# Patient Record
Sex: Female | Born: 1940 | Hispanic: Yes | Marital: Married | Smoking: Never smoker
Health system: Southern US, Community
[De-identification: ages and names within clinical notes are randomized; demographics above are authoritative.]

## PROBLEM LIST (undated history)

## (undated) DIAGNOSIS — M542 Cervicalgia: Secondary | ICD-10-CM

## (undated) DIAGNOSIS — M1990 Unspecified osteoarthritis, unspecified site: Secondary | ICD-10-CM

## (undated) DIAGNOSIS — R739 Hyperglycemia, unspecified: Secondary | ICD-10-CM

## (undated) HISTORY — PX: ABDOMINAL HYSTERECTOMY: SHX81

## (undated) HISTORY — DX: Unspecified osteoarthritis, unspecified site: M19.90

## (undated) HISTORY — PX: SHOULDER SURGERY: SHX246

---

## 2017-10-09 ENCOUNTER — Encounter (HOSPITAL_COMMUNITY): Payer: Self-pay | Admitting: *Deleted

## 2017-10-09 ENCOUNTER — Ambulatory Visit (HOSPITAL_COMMUNITY)
Admission: EM | Admit: 2017-10-09 | Discharge: 2017-10-09 | Disposition: A | Discharge status: Home / Self Care | Payer: Medicare Other | Attending: Internal Medicine | Admitting: Internal Medicine

## 2017-10-09 DIAGNOSIS — M542 Cervicalgia: Secondary | ICD-10-CM

## 2017-10-09 DIAGNOSIS — H8149 Vertigo of central origin, unspecified ear: Secondary | ICD-10-CM

## 2017-10-09 DIAGNOSIS — R42 Dizziness and giddiness: Secondary | ICD-10-CM

## 2017-10-09 HISTORY — DX: Cervicalgia: M54.2

## 2017-10-09 HISTORY — DX: Hyperglycemia, unspecified: R73.9

## 2017-10-09 LAB — POCT I-STAT, CHEM 8
BUN: 14 mg/dL (ref 8–23)
Calcium, Ion: 0.91 mmol/L — ABNORMAL LOW (ref 1.15–1.40)
Chloride: 102 mmol/L (ref 98–111)
Creatinine, Ser: 0.5 mg/dL (ref 0.44–1.00)
Glucose, Bld: 174 mg/dL — ABNORMAL HIGH (ref 70–99)
HCT: 45 % (ref 36.0–46.0)
Hemoglobin: 15.3 g/dL — ABNORMAL HIGH (ref 12.0–15.0)
Potassium: 3.8 mmol/L (ref 3.5–5.1)
Sodium: 134 mmol/L — ABNORMAL LOW (ref 135–145)
TCO2: 26 mmol/L (ref 22–32)

## 2017-10-09 MED ORDER — MECLIZINE HCL 12.5 MG PO TABS: 12.5000 mg | ORAL_TABLET | Freq: Three times a day (TID) | ORAL | 0 refills | Status: AC | PRN

## 2017-10-09 MED ORDER — MELOXICAM 7.5 MG PO TABS: 7.5000 mg | ORAL_TABLET | Freq: Every day | ORAL | 0 refills | Status: AC

## 2017-10-09 NOTE — ED Triage Notes (Signed)
C/O neck pain with vertigo and "seeing silvery shooting stars in my eyes".  States has neck vertebrae problems, has had all sxs in past with vertigo as well.  Has had visual disturbances with vertigo in past.

## 2017-10-09 NOTE — Discharge Instructions (Signed)
Your EKG looks normal Your blood pressure did drop when going from sitting to standing, please be sure to drink plenty of fluids to stay hydrated, please take your time when standing up, if you feel dizzy please sit down and that your self readjust.  Please use meclizine as needed when you will be at home or at bedtime to help with dizziness, this will cause drowsiness For your neck pain please take Mobic daily, do not use with Advil  Please follow-up if symptoms worsening, not improving, developing worsening lightheadedness, dizziness or pain.

## 2017-10-10 NOTE — ED Provider Notes (Signed)
[facility]    CSN: [id number] Arrival date & time: 10/09/17  1501     History   Chief Complaint Chief Complaint  Patient presents with  . Neck Pain  . Dizziness    [name] is a 77 y.o. female history of DM , HTN, vertigo, presenting today for evaluation of neck pain and dizziness. She states for the past 4 days she has had dizziness/room spinning with certain head movements. She has also occasionally felt lightheaded and saw spots. She has a history of vertigo and states she feels very similar to before but her symptoms have lasted longer than they typically do. She also notes pain in her neck, specifically on the right side which she typically does not get with her vertigo. She denies any injury, but does admit to increase in activity recently as she moved here from [place]. In the past meclizine has helped her. Denies SOB, chest pain, syncope, weakness. She still feels very strong. Drinks a lot of fluids.   HPI  Past Medical History:  Diagnosis Date  . Cervical pain   . Hyperglycemia     There are no active problems to display for this patient.   Past Surgical History:  Procedure Laterality Date  . SHOULDER SURGERY      OB History   None      Home Medications    Prior to Admission medications   Medication Sig Start Date End Date Taking? Authorizing Provider  Estrogens Conjugated (PREMARIN PO) Take by mouth.   Yes [provider]  GLIPIZIDE PO Take by mouth. Only occasionally   Yes [provider]  meclizine (ANTIVERT) 12.5 MG tablet Take 1 tablet (12.5 mg total) by mouth 3 (three) times daily as needed for dizziness. 10/09/17   Wieters, Hallie C, PA-C  meloxicam (MOBIC) 7.5 MG tablet Take 1 tablet (7.5 mg total) by mouth daily for 7 days. Take in the morning, with food. 10/09/17 10/16/17  Wieters, Junius CreamerHallie C, PA-C    Family History History reviewed. No pertinent family history.  Social History Social History   Tobacco Use   . Smoking status: Never Smoker  . Smokeless tobacco: Never Used  Substance Use Topics  . Alcohol use: Never    Frequency: Never  . Drug use: Never     Allergies   Patient has no known allergies.   Review of Systems Review of Systems  Constitutional: Negative for fatigue and fever.  HENT: Negative for congestion, sinus pressure and sore throat.   Eyes: Negative for photophobia, pain and visual disturbance.  Respiratory: Negative for cough and shortness of breath.   Cardiovascular: Negative for chest pain.  Gastrointestinal: Positive for nausea. Negative for abdominal pain and vomiting.  Genitourinary: Negative for decreased urine volume and hematuria.  Musculoskeletal: Positive for myalgias and neck pain. Negative for back pain, gait problem and neck stiffness.  Neurological: Positive for dizziness and light-headedness. Negative for syncope, facial asymmetry, speech difficulty, weakness, numbness and headaches.     Physical Exam Triage Vital Signs ED Triage Vitals  Enc Vitals Group     BP 10/09/17 1633 (!) 156/72     Pulse Rate 10/09/17 1633 72     Resp 10/09/17 1633 18     Temp 10/09/17 1633 97.6 F (36.4 C)     Temp src --      SpO2 10/09/17 1633 100 %     Weight --      Height --  Head Circumference --      Peak Flow --      Pain Score 10/09/17 1636 8     Pain Loc --      Pain Edu? --      Excl. in GC? --    Orthostatic VS for the past 24 hrs:  BP- Lying Pulse- Lying BP- Sitting Pulse- Sitting BP- Standing at 0 minutes Pulse- Standing at 0 minutes  10/09/17 1727 161/69 63 167/68 74 142/78 64    Updated Vital Signs BP (!) 156/72   Pulse 72   Temp 97.6 F (36.4 C)   Resp 18   SpO2 100%   Visual Acuity Right Eye Distance:   Left Eye Distance:   Bilateral Distance:    Right Eye Near:   Left Eye Near:    Bilateral Near:     Physical Exam  Constitutional: She is oriented to person, place, and time. She appears well-developed and well-nourished.  No distress.  HENT:  Head: Normocephalic and atraumatic.  Mouth/Throat: Oropharynx is clear and moist.  Eyes: Pupils are equal, round, and reactive to light. Conjunctivae and EOM are normal.  Neck: Neck supple.  Tenderness to right trapezius/neck musculature, full active ROM of neck, no carotid bruits auscultated  Cardiovascular: Normal rate and regular rhythm.  No murmur heard. Pulmonary/Chest: Effort normal and breath sounds normal. No respiratory distress.  Abdominal: Soft. There is no tenderness.  Musculoskeletal: She exhibits no edema.  Neurological: She is alert and oriented to person, place, and time.  Patient A&O x3, cranial nerves II-XII grossly intact, strength at shoulders, hips and knees 5/5, equal bilaterally, patellar reflex 2+ bilaterally. Normal Finger to nose, RAM and heel to shin. Negative Romberg and Pronator Drift. Gait without abnormality.   Skin: Skin is warm and dry.  Psychiatric: She has a normal mood and affect.  Nursing note and vitals reviewed.    UC Treatments / Results  Labs (all labs ordered are listed, but only abnormal results are displayed) Labs Reviewed  POCT I-STAT, CHEM 8 - Abnormal; Notable for the following components:      Result Value   Sodium 134 (*)    Glucose, Bld 174 (*)    Calcium, Ion 0.91 (*)    Hemoglobin 15.3 (*)    All other components within normal limits    EKG None  Radiology No results found.  Procedures Procedures (including critical care time)  Medications Ordered in UC Medications - No data to display  Initial Impression / Assessment and Plan / UC Course  I have reviewed the triage vital signs and the nursing notes.  Pertinent labs & imaging results that were available during my care of the patient were reviewed by me and considered in my medical decision making (see chart for details).     Istat relatively normal, minor abberations. EKG normal sinus rhythm, no acute signs of ischemia or infarction or  arrythmia/abnormal beats. Patient was orthostatic going from sitting to standing but resolved. Symptoms likely combo of orthostasis and vertigo. Meclizine PRN for nausea and dizziness, discussed drowsiness with patient and increased risk of falls. mobic 7.5 for neck pain as alternative for advil. Seems muscular.   VSS, no neuro deficit, no red flags; will have patient return if symptoms worsening/changing. Discussed strict return precautions. Patient verbalized understanding and is agreeable with plan.  Final Clinical Impressions(s) / UC Diagnoses   Final diagnoses:  Neck pain  Vertigo     Discharge Instructions     Your  EKG looks normal Your blood pressure did drop when going from sitting to standing, please be sure to drink plenty of fluids to stay hydrated, please take your time when standing up, if you feel dizzy please sit down and that your self readjust.  Please use meclizine as needed when you will be at home or at bedtime to help with dizziness, this will cause drowsiness For your neck pain please take Mobic daily, do not use with Advil  Please follow-up if symptoms worsening, not improving, developing worsening lightheadedness, dizziness or pain.   ED Prescriptions    Medication Sig Dispense Auth. Provider   meclizine (ANTIVERT) 12.5 MG tablet Take 1 tablet (12.5 mg total) by mouth 3 (three) times daily as needed for dizziness. 30 tablet Wieters, Hallie C, PA-C   meloxicam (MOBIC) 7.5 MG tablet Take 1 tablet (7.5 mg total) by mouth daily for 7 days. Take in the morning, with food. 7 tablet Wieters, Hallie C, PA-C     Controlled Substance Prescriptions [place] Controlled Substance Registry consulted? Not Applicable   Lew Dawes, [place] 10/10/17 [phone]

## 2018-11-01 ENCOUNTER — Other Ambulatory Visit: Payer: Self-pay | Admitting: Surgery

## 2018-11-04 ENCOUNTER — Other Ambulatory Visit: Payer: Self-pay | Admitting: Surgery

## 2018-11-04 DIAGNOSIS — R1032 Left lower quadrant pain: Secondary | ICD-10-CM

## 2018-11-11 ENCOUNTER — Other Ambulatory Visit: Payer: Self-pay

## 2018-11-11 ENCOUNTER — Ambulatory Visit
Admission: RE | Admit: 2018-11-11 | Discharge: 2018-11-11 | Disposition: A | Discharge status: Home / Self Care | Payer: Medicare PPO | Source: Ambulatory Visit | Attending: Surgery | Admitting: Surgery

## 2018-11-11 DIAGNOSIS — R1032 Left lower quadrant pain: Secondary | ICD-10-CM

## 2018-11-11 IMAGING — CT CT ABD-PELV W/ CM
1 of 3 series · 13 of 32 positions shown, 18 images · IV contrast (APPLIED)
Comparison: None.

CLINICAL DATA: Colicky left lower quadrant pain.

EXAM:
CT ABDOMEN AND PELVIS WITH CONTRAST
TECHNIQUE: Multidetector CT imaging of the abdomen and pelvis was performed
using the standard protocol following bolus administration of
intravenous contrast.
CONTRAST:  100mL [R3] IOPAMIDOL ([R3]) INJECTION 61%

[Series 2: abd/pelvis w/cm · axial · 0.73mm/px · z∈[-400,+0]mm · 13 of 94 slices shown, 18 images]
[im 7/94  soft-tissue]
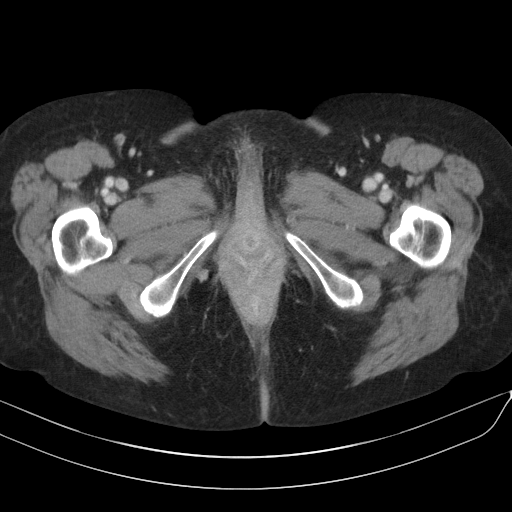
[im 7/94  bone]
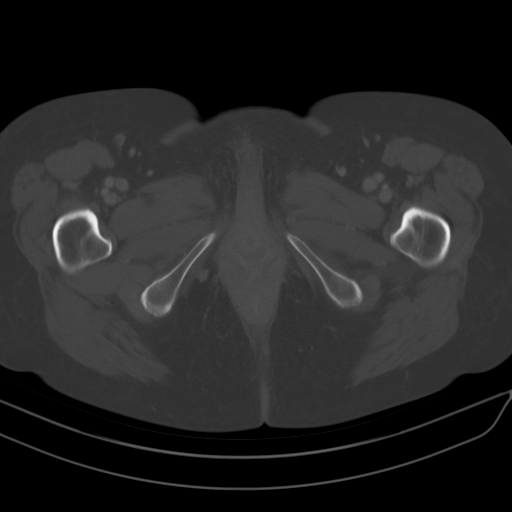
[im 13/94  soft-tissue]
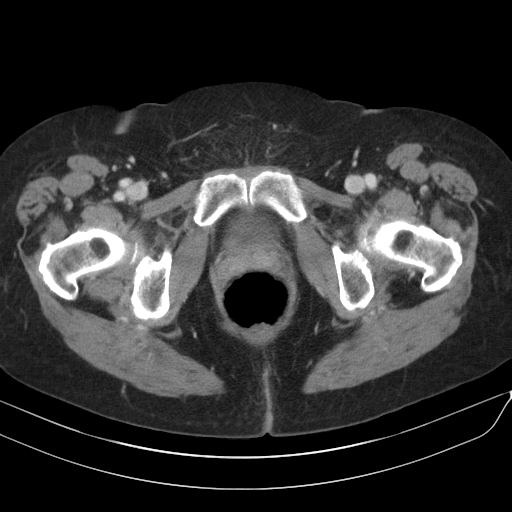
[im 19/94  soft-tissue]
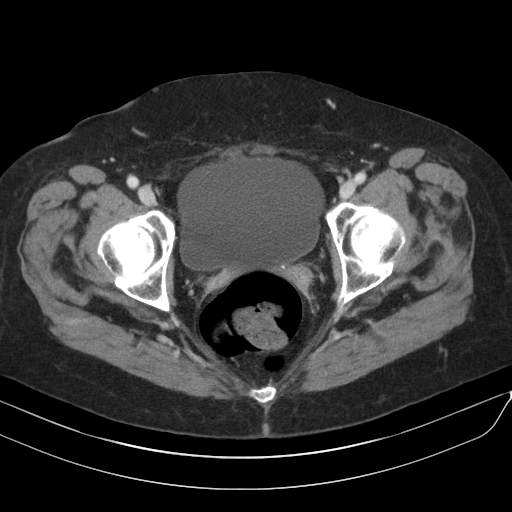
[im 32/94  soft-tissue]
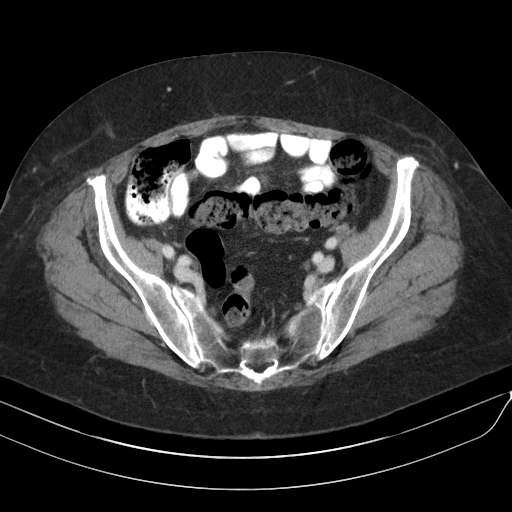
[im 38/94  soft-tissue]
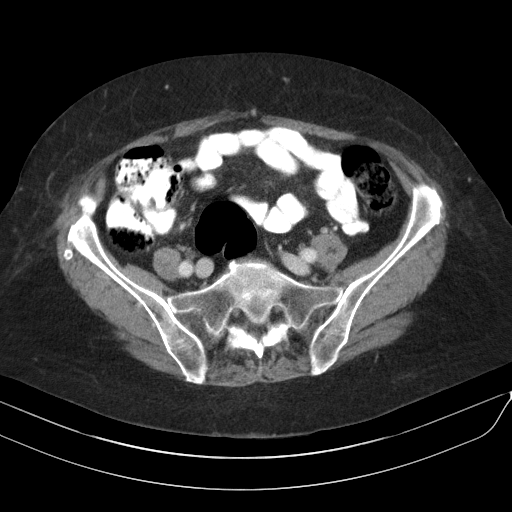
[im 44/94  soft-tissue]
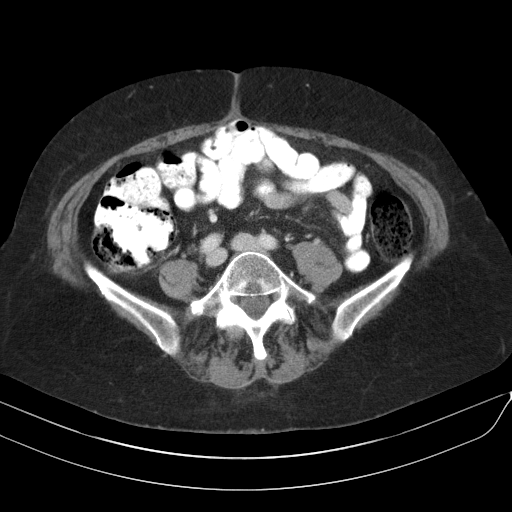
[im 50/94  soft-tissue]
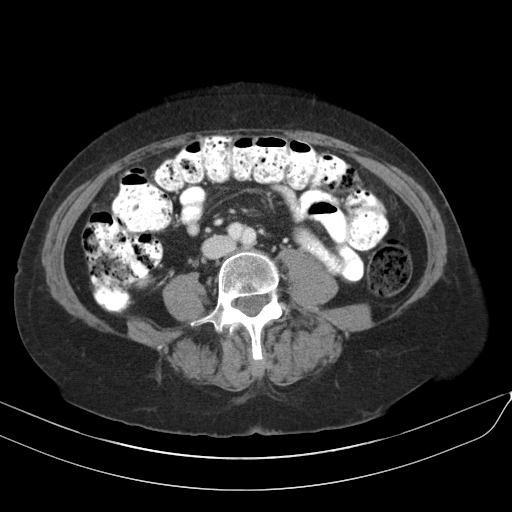
[im 56/94  soft-tissue]
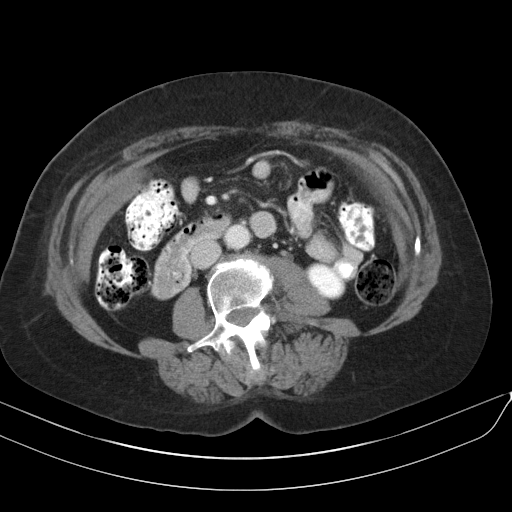
[im 63/94  soft-tissue]
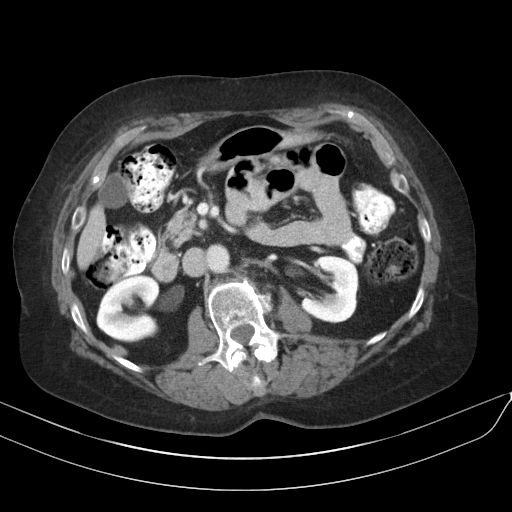
[im 63/94  bone]
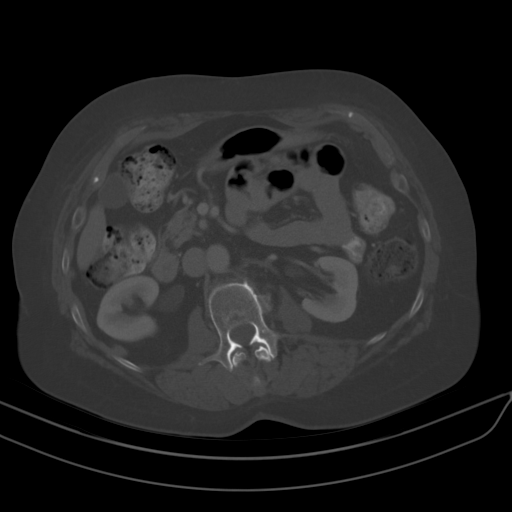
[im 69/94  lung]
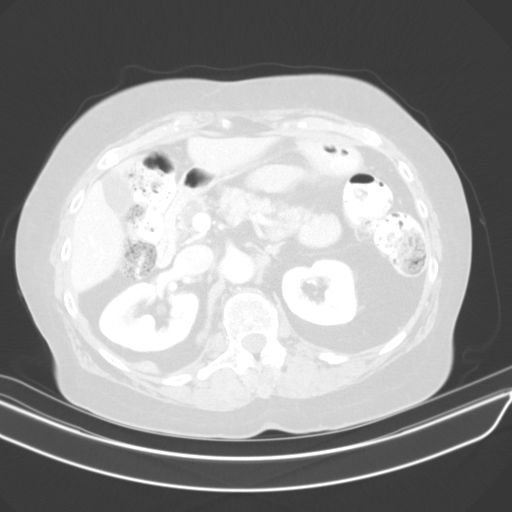
[im 75/94  soft-tissue]
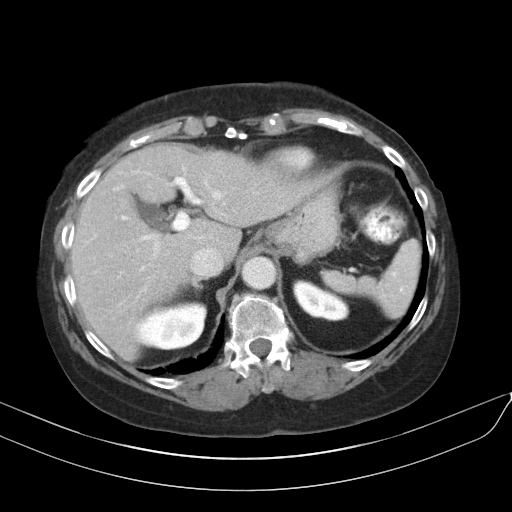
[im 75/94  lung]
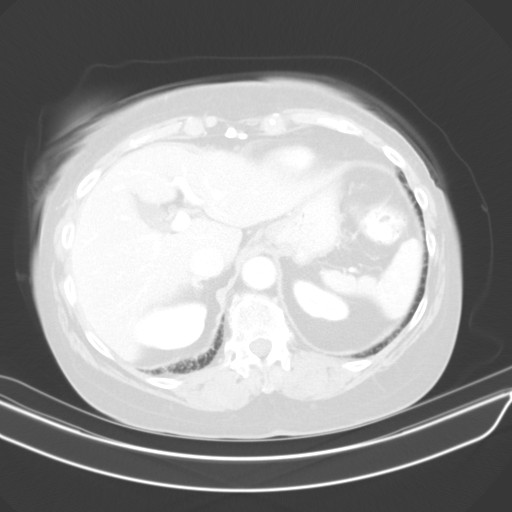
[im 81/94  soft-tissue]
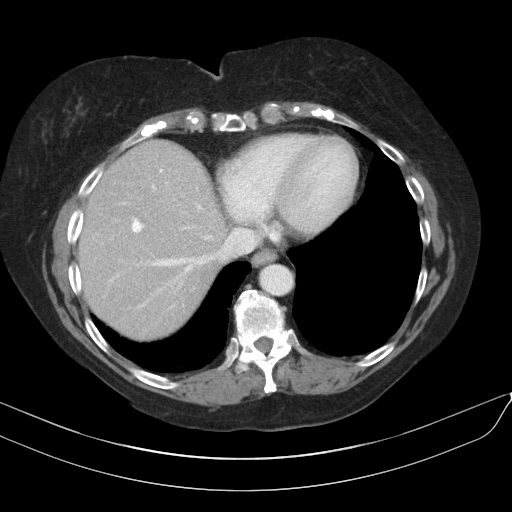
[im 81/94  lung]
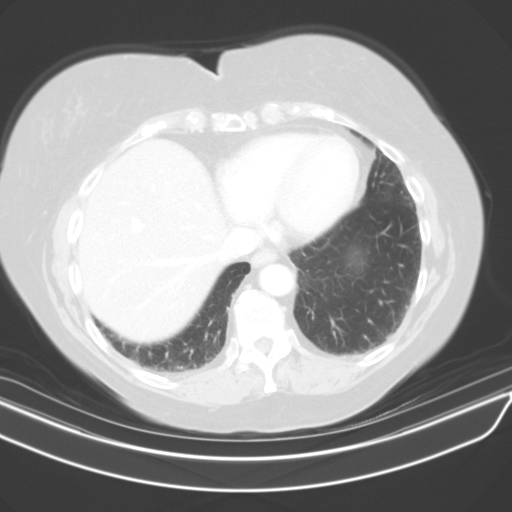
[im 87/94  soft-tissue]
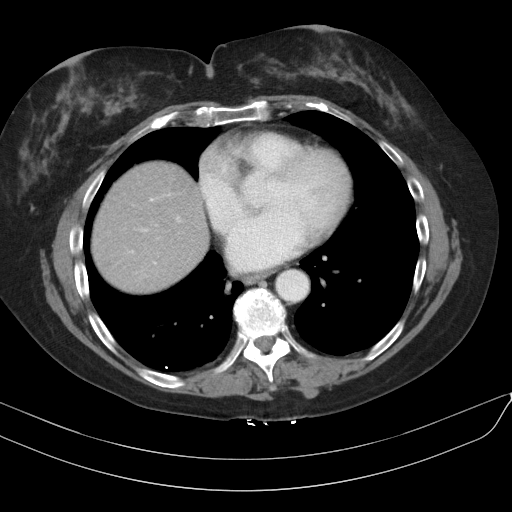
[im 87/94  lung]
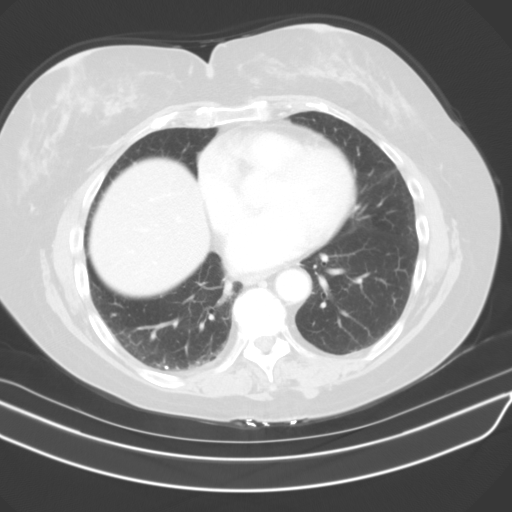

[13 of 32 positions shown; findings below may reference images not displayed]

FINDINGS: Lower chest: Irregular pleural thickening noted right hemithorax,
incompletely visualized but measuring approximately 2.0 x 0.9 cm
(image 1/series 3).

Hepatobiliary: 1.8 cm low-density lesion identified in segment IV B.
This may be focal fatty deposition but cannot be definitively
characterized by CT. There is no evidence for gallstones,
gallbladder wall thickening, or pericholecystic fluid. No
intrahepatic or extrahepatic biliary dilation.

Pancreas: 1.9 x 1.2 cm cystic lesion is identified in the head of
pancreas ([DATE]). No associated dilatation of the main pancreatic
duct. Marked atrophy of the adjacent pancreatic parenchyma noted.

Spleen: No splenomegaly. No focal mass lesion.

Adrenals/Urinary Tract: No adrenal nodule or mass. Kidneys
unremarkable. No evidence for hydroureter. The urinary bladder
appears normal for the degree of distention.

Stomach/Bowel: Stomach is unremarkable. No gastric wall thickening.
No evidence of outlet obstruction. Duodenum is normally positioned
as is the ligament of Treitz. No small bowel wall thickening. No
small bowel dilatation. The terminal ileum is normal. The appendix
is not visualized, but there is no edema or inflammation in the
region of the cecum. No gross colonic mass. No colonic wall
thickening. No substantial diverticular disease in the colon.

Vascular/Lymphatic: No abdominal aortic aneurysm. 1.7 cm calcified
saccular aneurysm of the mid splenic artery. There is no
gastrohepatic or hepatoduodenal ligament lymphadenopathy. No
intraperitoneal or retroperitoneal lymphadenopathy. No pelvic
sidewall lymphadenopathy.

Reproductive: The uterus is surgically absent. There is no adnexal
mass.

Other: No intraperitoneal free fluid.

Musculoskeletal: No worrisome lytic or sclerotic osseous
abnormality.
IMPRESSION: 1. Focal irregular pleural thickening right hemithorax, incompletely
visualized. Dedicated CT chest without contrast recommended to
further evaluate.
2. 1.8 cm subcapsular low-density lesion in the medial segment left
liver this may be focal fatty deposition but is fairly focal. MRI of
the abdomen without and with contrast recommended to further
evaluate.
3. 1.9 x 1.2 cm cystic lesion in the head of the pancreas. This
could also be further assessed at the time of follow-up MRI.
[DATE] cm calcified saccular aneurysm of the mid splenic artery.

## 2018-11-11 MED ORDER — IOPAMIDOL (ISOVUE-300) INJECTION 61%
100.0000 mL | Freq: Once | INTRAVENOUS | Status: AC | PRN
  Administered 2018-11-11: 100 mL via INTRAVENOUS

## 2019-02-14 ENCOUNTER — Other Ambulatory Visit: Payer: Self-pay | Admitting: Surgery

## 2019-02-14 DIAGNOSIS — K862 Cyst of pancreas: Secondary | ICD-10-CM

## 2019-03-14 ENCOUNTER — Other Ambulatory Visit: Payer: Self-pay

## 2019-03-14 ENCOUNTER — Ambulatory Visit
Admission: RE | Admit: 2019-03-14 | Discharge: 2019-03-14 | Disposition: A | Discharge status: Home / Self Care | Payer: Medicare PPO | Source: Ambulatory Visit | Attending: Surgery | Admitting: Surgery

## 2019-03-14 DIAGNOSIS — K862 Cyst of pancreas: Secondary | ICD-10-CM

## 2019-03-14 IMAGING — MR MR ABDOMEN WO/W CM
14 of 20 series · 30 of 48 positions shown · IV contrast (14 ml multihance)
Comparison: CT scan [DATE]

CLINICAL DATA: Pancreatic head cyst seen on previous CT.

EXAM:
MRI ABDOMEN WITHOUT AND WITH CONTRAST
TECHNIQUE: Multiplanar multisequence MR imaging of the abdomen was performed
both before and after the administration of intravenous contrast.
CONTRAST:  14mL MULTIHANCE GADOBENATE DIMEGLUMINE 529 MG/ML IV SOLN

[Series 3: T2 · coronal · 5.0mm · 1.41mm/px · 2 of 24 slices shown (1 of 3)]
[im 1/24]
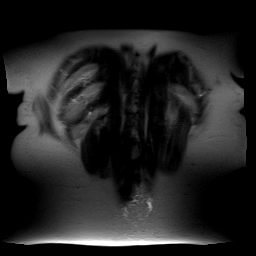
[im 24/24]
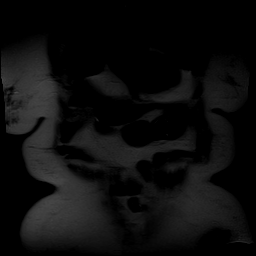

[Series 4: T2 · axial · 6.0mm · 1.41mm/px · z∈[-41,+159]mm · 2 of 30 slices shown (2 of 3)]
[im 1/30]
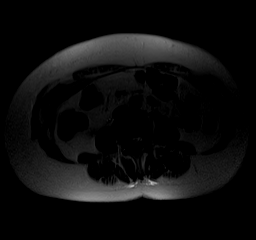
[im 30/30]
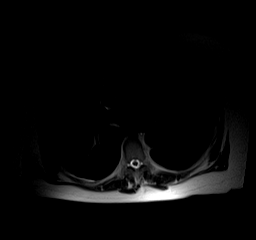

[Series 7: axial in out · axial · 6.0mm · 0.70mm/px · z∈[-35,+165]mm · 4 of 60 slices shown]
[im 1/60]
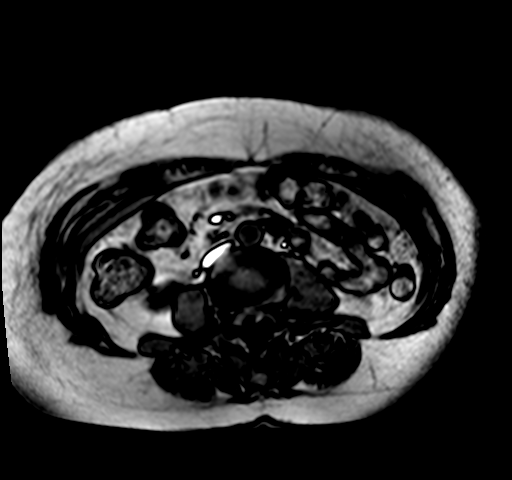
[im 20/60]
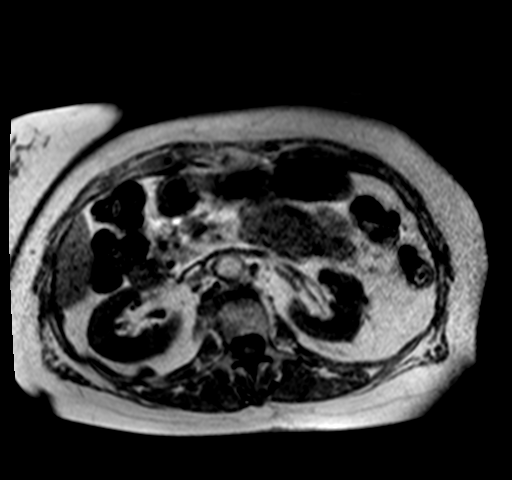
[im 40/60]
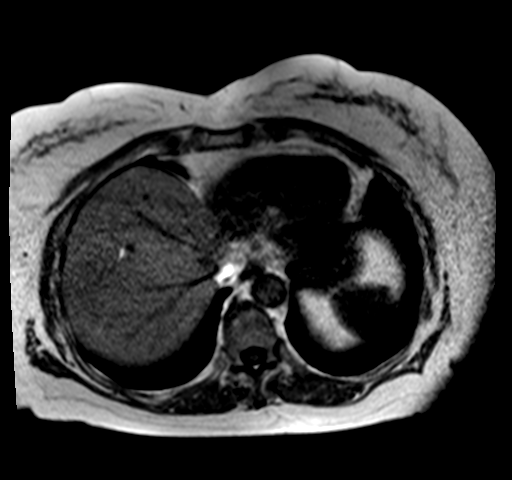
[im 60/60]
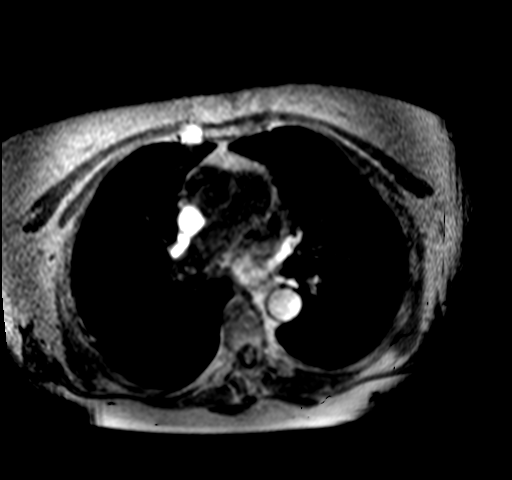

[Series 8: axial tru fisp · axial · 5.0mm · 1.41mm/px · 1 of 21 slices shown]
[im 1/21]
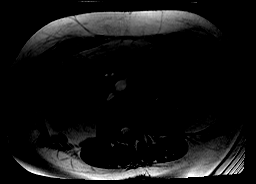

[Series 9: T2 · axial · 6.0mm · 0.70mm/px · 1 of 30 slices shown (3 of 3)]
[im 1/30]
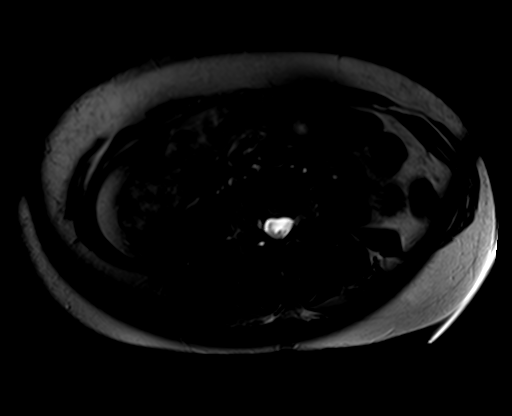

[Series 10: ep2d_diff_b50_500_800_p2-resp · 1 of 4 slices shown]
[im 1/4]
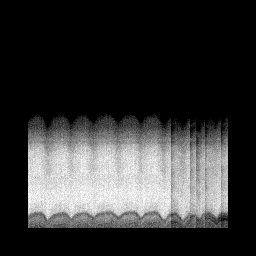

[Series 11: ep2d_diff_b50_500_800_p2 · axial · 6.0mm · 1.88mm/px · z∈[-44,+156]mm · 4 of 90 slices shown]
[im 1/90]
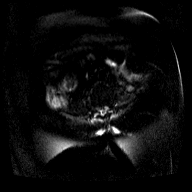
[im 30/90]
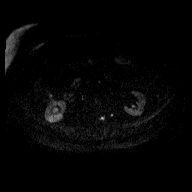
[im 60/90]
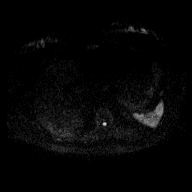
[im 90/90]
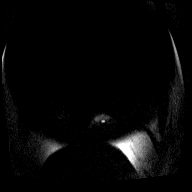

[Series 12: ep2d_diff_b50_500_800_p2_adc · axial · 6.0mm · 1.88mm/px · 1 of 30 slices shown]
[im 1/30]
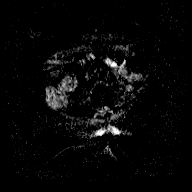

[Series 15: cor haste fs · coronal · 3.0mm · 0.70mm/px · 1 of 19 slices shown]
[im 1/19]
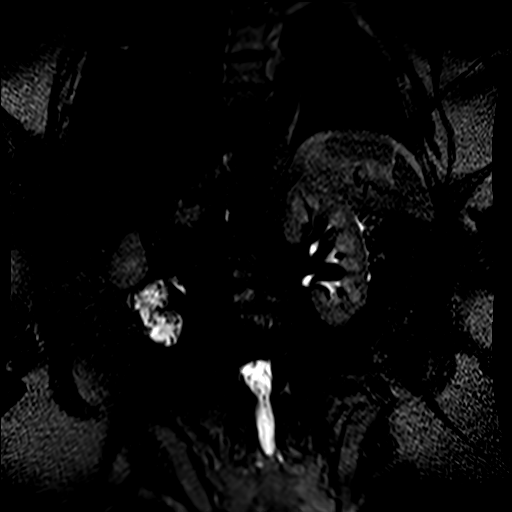

[Series 16: T1 dynamic · axial · non-contrast · 2.5mm · 0.70mm/px · z∈[-34,+164]mm · 3 of 80 slices shown]
[im 1/80]
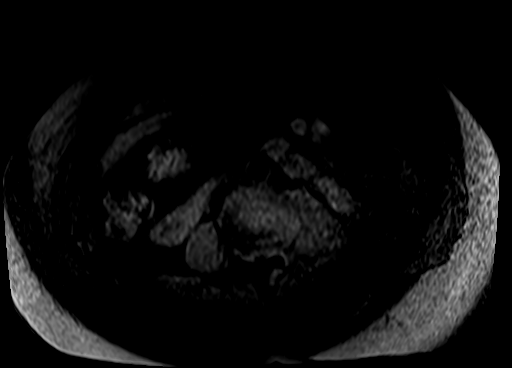
[im 40/80]
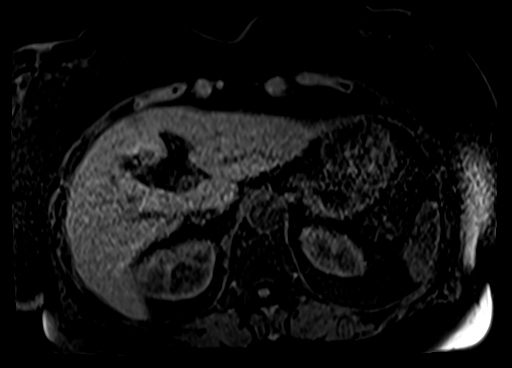
[im 80/80]
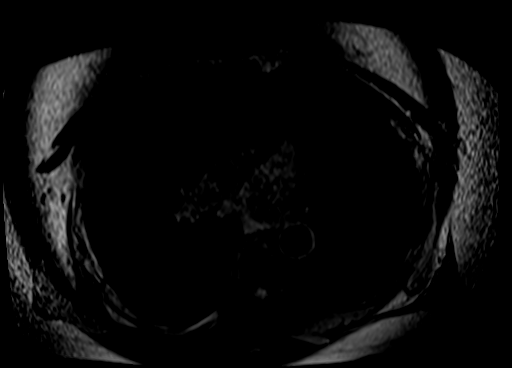

[Series 17: post 25 sec · axial · 2.5mm · 0.70mm/px · z∈[-34,+164]mm · 3 of 80 slices shown]
[im 1/80]
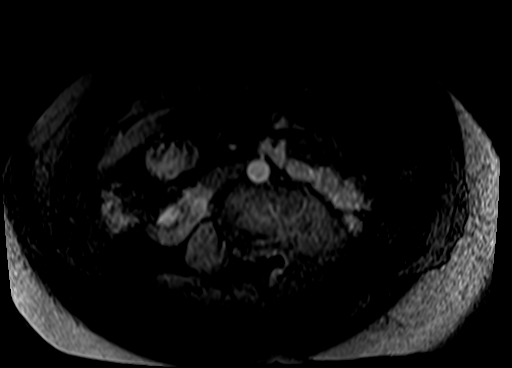
[im 40/80]
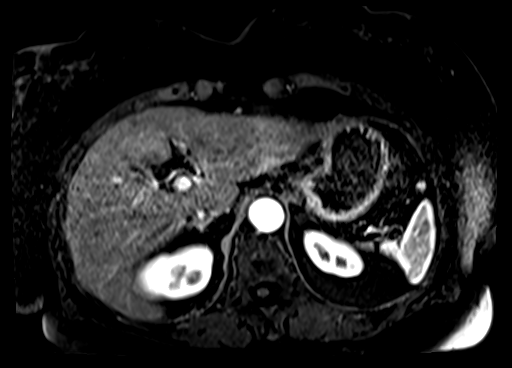
[im 80/80]
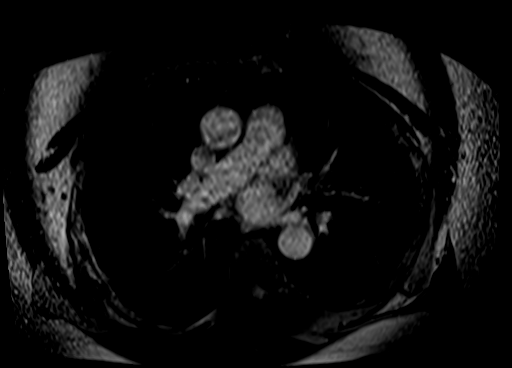

[Series 18: post 25 sec_sub · axial · 2.5mm · 0.70mm/px · z∈[-34,+164]mm · 3 of 80 slices shown]
[im 1/80]
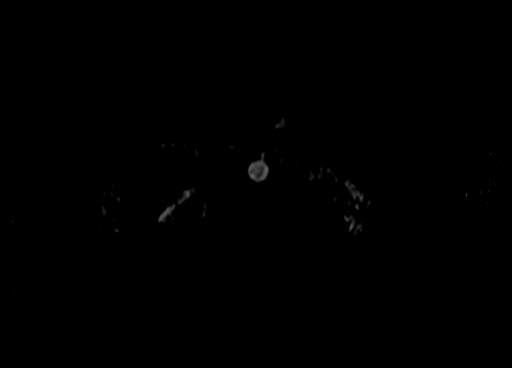
[im 40/80]
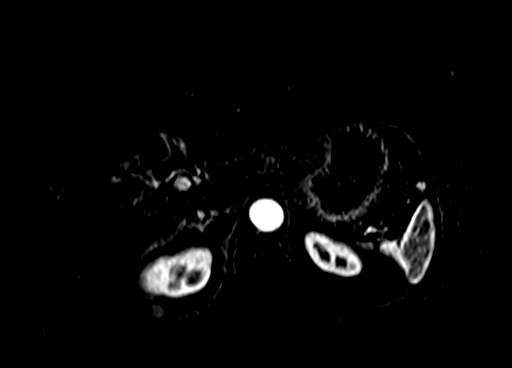
[im 80/80]
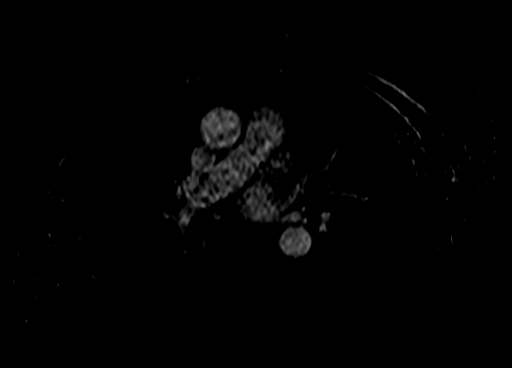

[Series 19: post 45 sec · axial · 2.5mm · 0.70mm/px · z∈[-34,+164]mm · 3 of 80 slices shown]
[im 1/80]
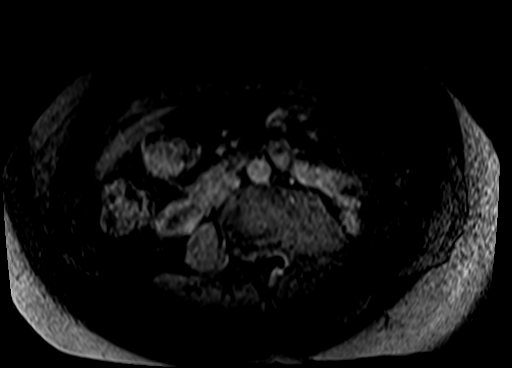
[im 40/80]
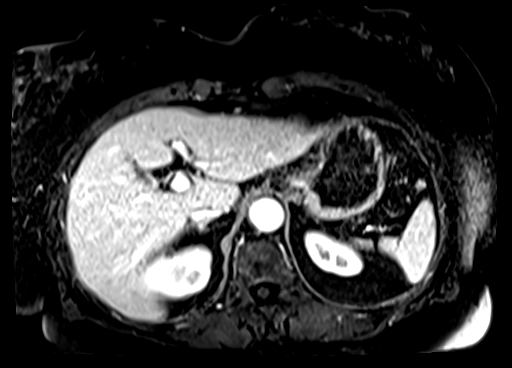
[im 80/80]
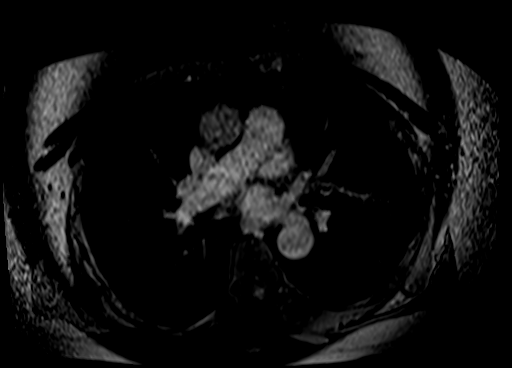

[Series 20: post 45 sec_sub · axial · 2.5mm · 0.70mm/px · 1 of 80 slices shown]
[im 1/80]
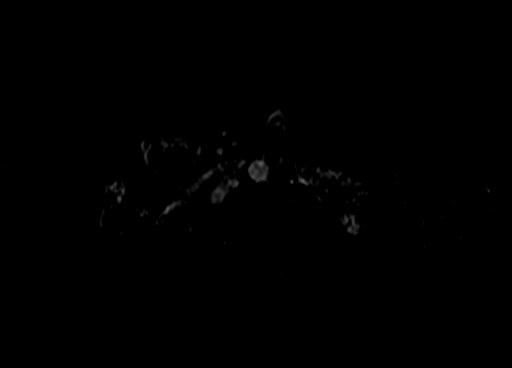

[30 of 48 positions shown; findings below may reference images not displayed]

FINDINGS: Lower chest: Unremarkable.

Hepatobiliary: 1.8 cm subcapsular lesion seen on the previous CT
scan involving the medial segment of the left liver shows clear loss
of signal intensity on out of phase T1 imaging, compatible with the
focal fatty deposition. Liver parenchyma otherwise unremarkable.
Gallbladder is nondistended and no gallstones are evident. No
intrahepatic or extrahepatic biliary dilation.

Pancreas: 2 cm lobular cystic lesion is identified in the head of
the pancreas. Direct communication to the main pancreatic duct can
not be established. There are multiple additional cystic lesions
identified in the pancreatic body and tail measuring up to 8 mm.
Some of these tiny lesions do appear to communicate with the main
pancreatic duct. No main pancreatic duct dilatation. No suspicious
enhancement associated with any of these lesions.

Spleen:  No splenomegaly. No focal mass lesion.

Adrenals/Urinary Tract: No adrenal nodule or mass. Kidneys
unremarkable.

Stomach/Bowel: Stomach is unremarkable. No gastric wall thickening.
No evidence of outlet obstruction. Duodenum is normally positioned
as is the ligament of Treitz. No small bowel or colonic dilatation
within the visualized abdomen.

Vascular/Lymphatic: No abdominal aortic aneurysm. 14 mm saccular
aneurysm of the proximal to mid splenic artery, thrombosed. No
abdominal lymphadenopathy.

Other:  No intraperitoneal free fluid.

Musculoskeletal: No abnormal marrow enhancement within the
visualized bony anatomy
IMPRESSION: 1. 2 cm lobular cystic lesion in the head of the pancreas.
Additional tiny cystic foci are seen scattered in the body and tail
of pancreas some of which appear to communicate with the main
pancreatic duct. For cysts of this size in a patient of this age,
consensus guidelines recommend repeat MRI in 2 years. This
recommendation follows ACR consensus guidelines: Management of
Incidental Pancreatic Cysts: A White Paper of the ACR Incidental
Findings Committee. [HOSPITAL] [J5];[DATE].
2. Subcapsular left liver lesion of concern on recent CT represents
focal fatty deposition.

## 2019-03-14 MED ORDER — GADOBENATE DIMEGLUMINE 529 MG/ML IV SOLN
14.0000 mL | Freq: Once | INTRAVENOUS | Status: AC | PRN
  Administered 2019-03-14: 14 mL via INTRAVENOUS

## 2019-08-15 ENCOUNTER — Other Ambulatory Visit: Payer: Self-pay | Admitting: Internal Medicine

## 2019-08-15 DIAGNOSIS — D860 Sarcoidosis of lung: Secondary | ICD-10-CM

## 2019-09-22 ENCOUNTER — Other Ambulatory Visit: Payer: Self-pay | Admitting: Internal Medicine

## 2019-09-25 ENCOUNTER — Ambulatory Visit
Admission: RE | Admit: 2019-09-25 | Discharge: 2019-09-25 | Disposition: A | Discharge status: Home / Self Care | Payer: Medicare PPO | Source: Ambulatory Visit | Attending: Internal Medicine | Admitting: Internal Medicine

## 2019-09-25 DIAGNOSIS — D860 Sarcoidosis of lung: Secondary | ICD-10-CM

## 2019-09-25 IMAGING — CT CT CHEST W/O CM
2 of 4 series · 14 of 36 positions shown, 17 images · non-contrast
Comparison: MR abdomen, [DATE], CT abdomen pelvis, [DATE]

CLINICAL DATA: Follow-up sarcoidosis, pleural thickening seen on
prior CT of the abdomen pelvis

EXAM:
CT CHEST WITHOUT CONTRAST
TECHNIQUE: Multidetector CT imaging of the chest was performed following the
standard protocol without IV contrast.

[Series 2: chest 2.00 br40 s3 · axial · 0.77mm/px · z∈[+1155,+1383]mm · 11 of 136 slices shown, 14 images (1 of 2)]
[im 11/136  mediastinal]
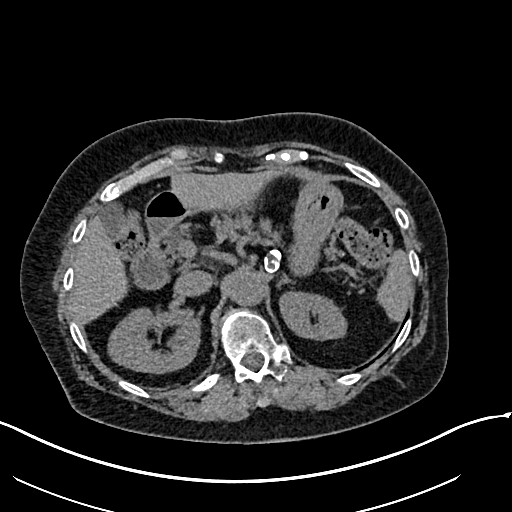
[im 11/136  lung]
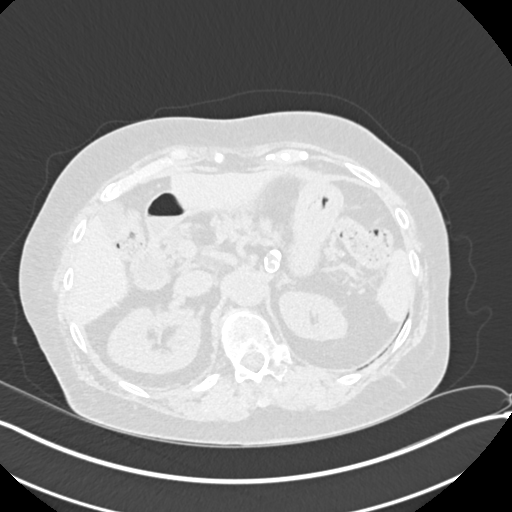
[im 21/136  lung]
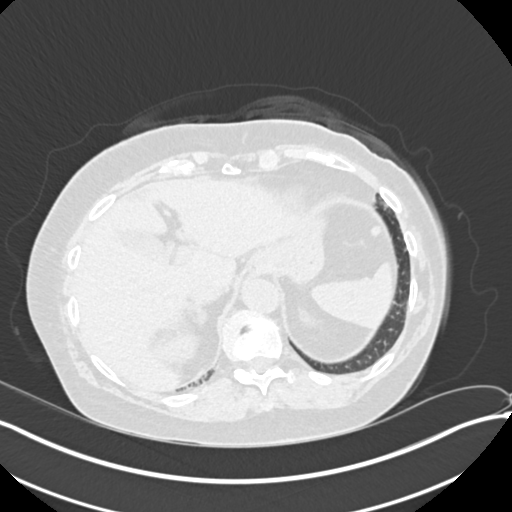
[im 32/136  lung]
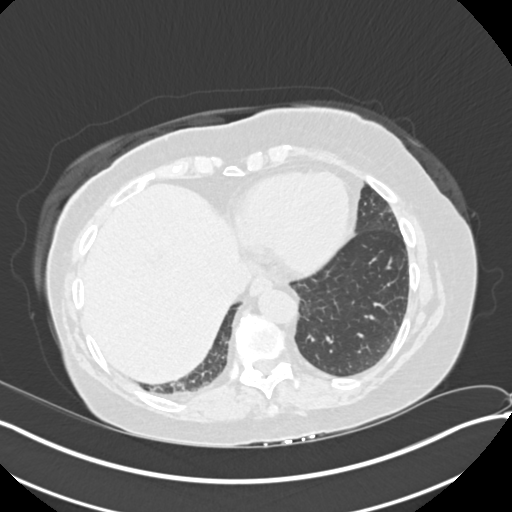
[im 42/136  lung]
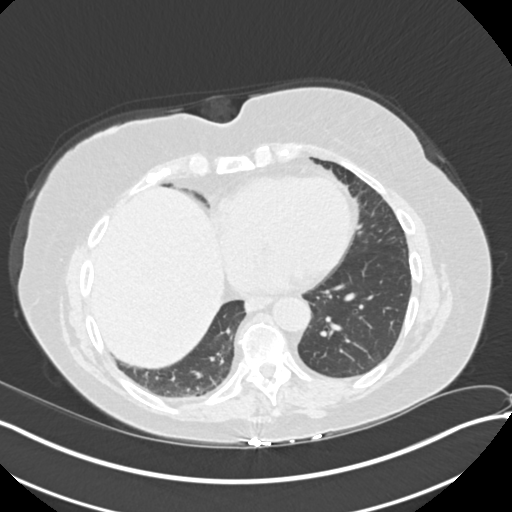
[im 52/136  mediastinal]
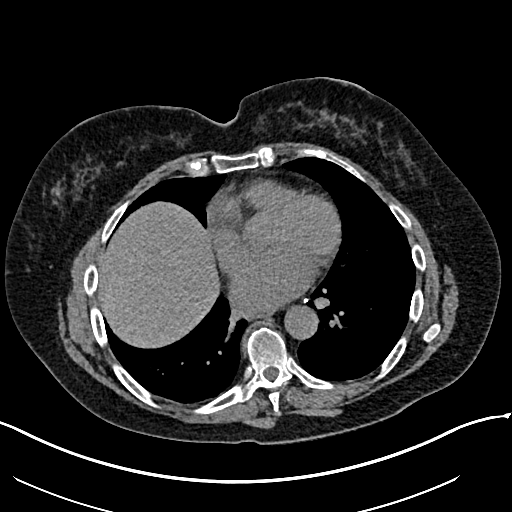
[im 52/136  lung]
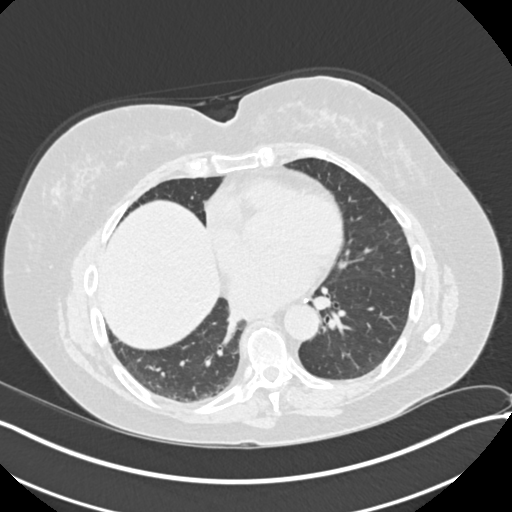
[im 73/136  lung]
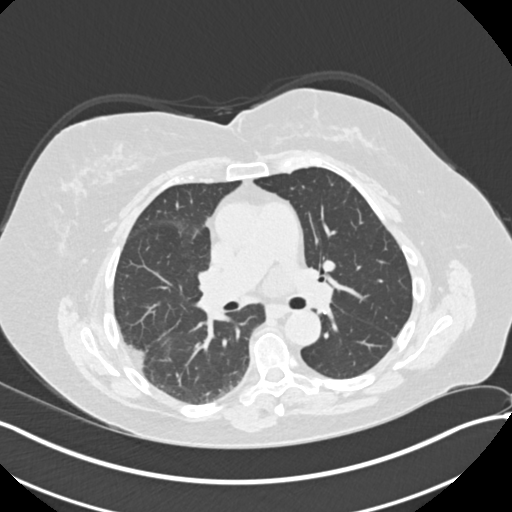
[im 84/136  lung]
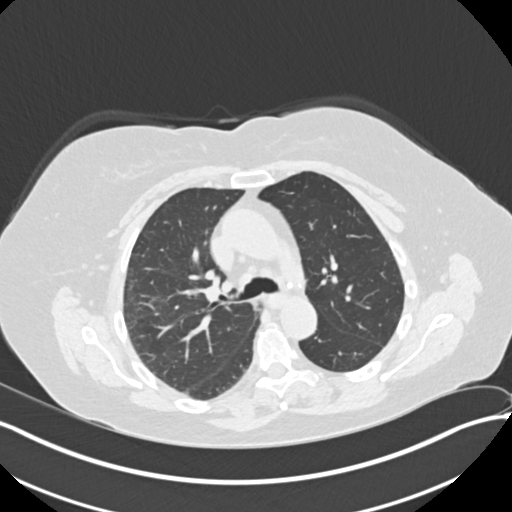
[im 94/136  lung]
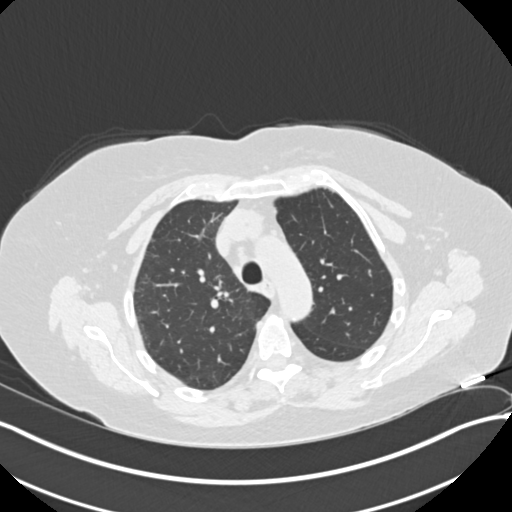
[im 104/136  mediastinal]
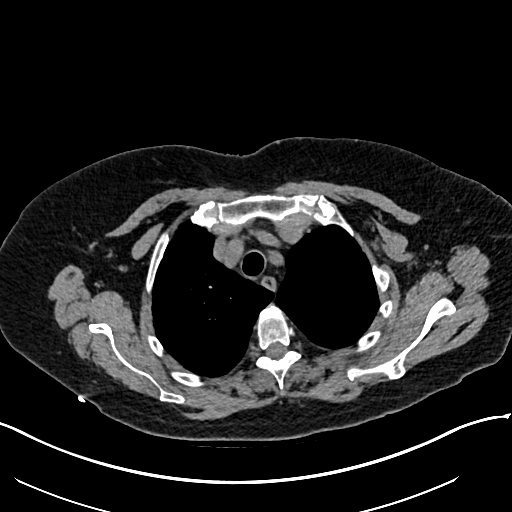
[im 104/136  lung]
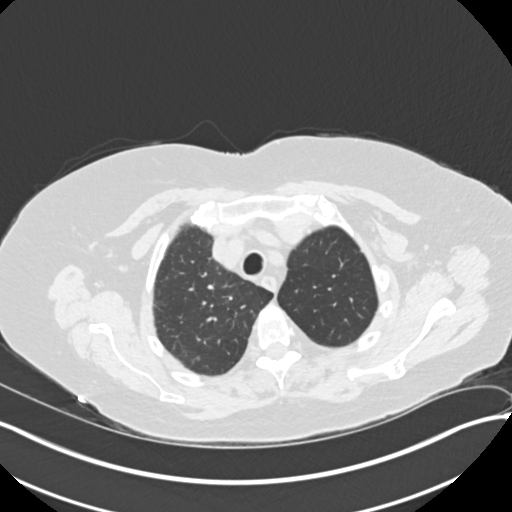
[im 115/136  lung]
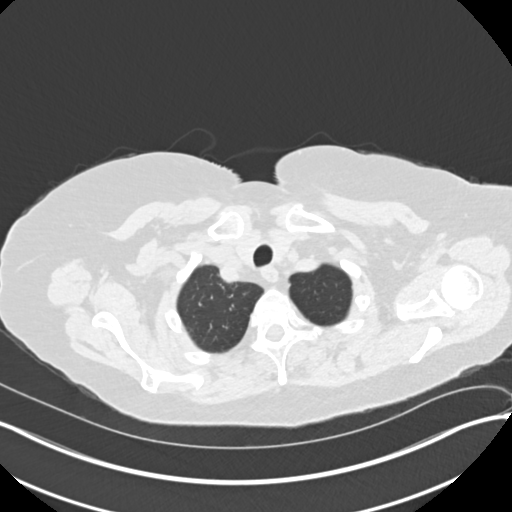
[im 125/136  lung]
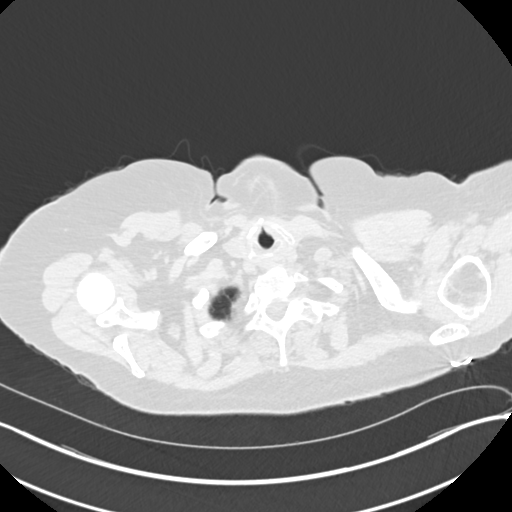

[Series 4: chest 2.00 br40 s3 · coronal · 0.53mm/px · 3 of 196 slices shown (2 of 2)]
[im 40/196  lung]
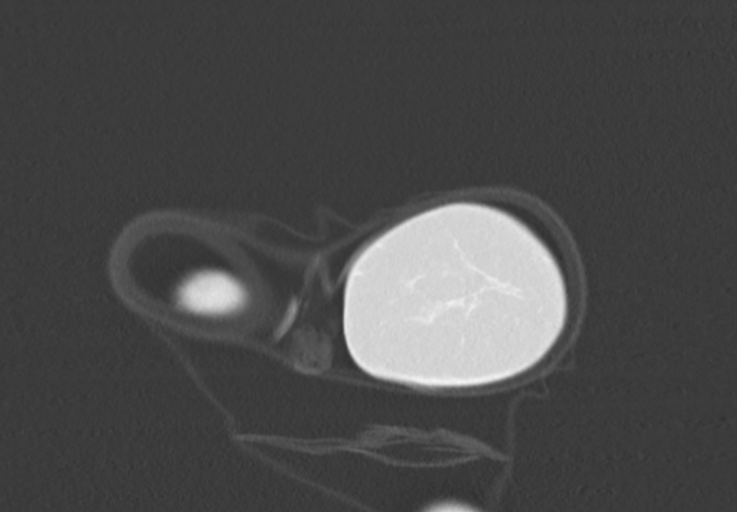
[im 79/196  lung]
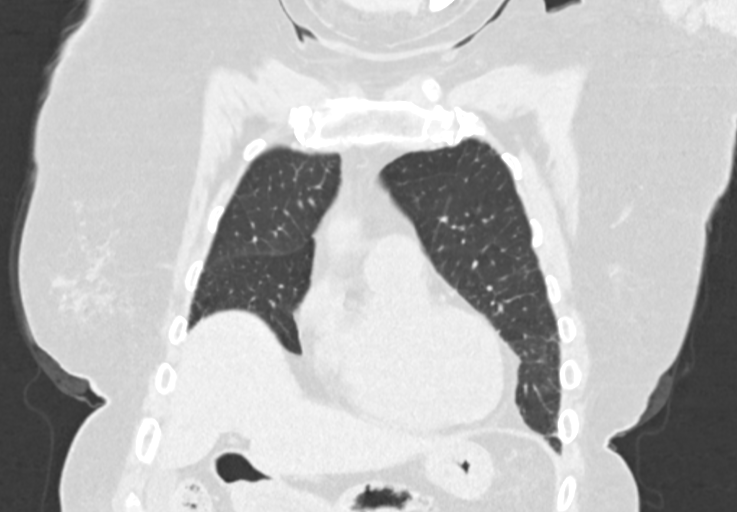
[im 118/196  lung]
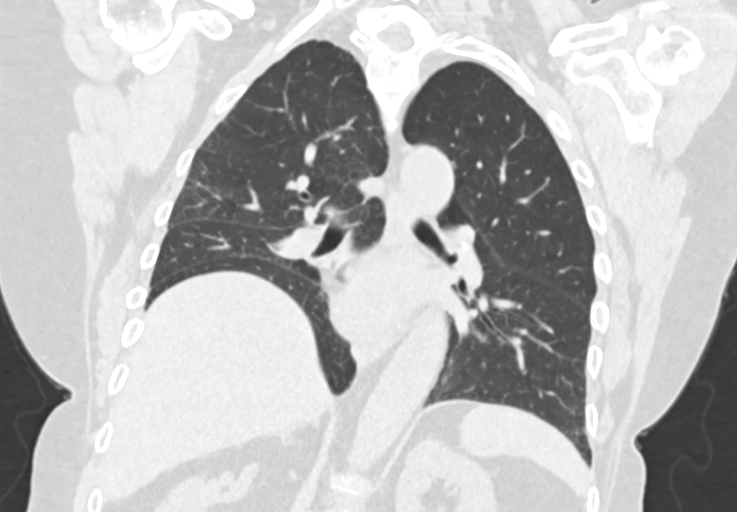

[14 of 36 positions shown; findings below may reference images not displayed]

FINDINGS: Cardiovascular: Scattered aortic atherosclerosis. Normal heart size.
No pericardial effusion.

Mediastinum/Nodes: No enlarged mediastinal, hilar, or axillary lymph
nodes. Small calcified left hilar lymph nodes. Thyroid gland,
trachea, and esophagus demonstrate no significant findings.

Lungs/Pleura: Elevation of the right hemidiaphragm with mild
associated right basilar scarring or atelectasis. Occasional
scattered, calcified and benign small pulmonary nodules. There is
mild, bibasilar predominant irregular peripheral interstitial and
ground-glass opacity.

Upper Abdomen: No acute abnormality. Densely calcified aneurysm of
the proximal splenic artery measuring 1.8 x 1.3 cm (series 2, image
126).

Musculoskeletal: No chest wall mass or suspicious bone lesions
identified.
IMPRESSION: 1. There is mild, bibasilar predominant irregular peripheral
interstitial and ground-glass opacity, generally nonspecific and not
characteristic in appearance for fibrotic pulmonary sarcoidosis. If
characterized by ATS pulmonary fibrosis criteria, these findings are
in an "indeterminate for UIP" pattern. Consider follow-up ILD
protocol CT in 1 year to observe for stability of fibrotic findings
and pattern if there is clinical concern for fibrotic interstitial
lung disease.
2. Occasional scattered, calcified and benign small pulmonary
nodules. No CT follow-up is required.
3. Elevation of the right hemidiaphragm with mild associated right
basilar scarring or atelectasis.
4. Densely calcified aneurysm of the proximal splenic artery
measuring 1.8 x 1.3 cm in the included upper abdomen, unchanged
compared to prior examination.
5. Aortic Atherosclerosis ([HM]-[HM]).

## 2019-10-17 ENCOUNTER — Institutional Professional Consult (permissible substitution): Payer: Medicare PPO | Admitting: Internal Medicine

## 2019-10-18 ENCOUNTER — Other Ambulatory Visit: Payer: Self-pay

## 2019-10-18 ENCOUNTER — Ambulatory Visit (INDEPENDENT_AMBULATORY_CARE_PROVIDER_SITE_OTHER): Payer: Medicare PPO | Admitting: Internal Medicine

## 2019-10-18 ENCOUNTER — Encounter: Payer: Self-pay | Admitting: Internal Medicine

## 2019-10-18 VITALS — BP 126/72 | HR 74 | Temp 96.1°F | Ht 61.0 in | Wt 160.0 lb

## 2019-10-18 DIAGNOSIS — R0600 Dyspnea, unspecified: Secondary | ICD-10-CM

## 2019-10-18 DIAGNOSIS — J8410 Pulmonary fibrosis, unspecified: Secondary | ICD-10-CM | POA: Diagnosis not present

## 2019-10-18 DIAGNOSIS — J849 Interstitial pulmonary disease, unspecified: Secondary | ICD-10-CM

## 2019-10-18 DIAGNOSIS — R0609 Other forms of dyspnea: Secondary | ICD-10-CM

## 2019-10-18 DIAGNOSIS — R05 Cough: Secondary | ICD-10-CM

## 2019-10-18 DIAGNOSIS — R059 Cough, unspecified: Secondary | ICD-10-CM

## 2019-10-18 LAB — SEDIMENTATION RATE: Sed Rate: 15 mm/hr (ref 0–30)

## 2019-10-18 NOTE — Progress Notes (Signed)
OV 10/18/2019  Subjective:  [name], female , DOB: Apr 11, 1940 , age 79 y.o. , MRN: [id number] , ADDRESS: [place] [id number]   10/18/2019 -   Chief Complaint  Patient presents with  . Consult    reports feeling at baseline today. reports cough stopped after completing recent course of abx.     [name] 79 y.o. - [place].  She is 79 year old city area.  Then around 22 years ago moved to [facility] and subsequently to [facility].  She is here because of an abnormal CT chest.  She states some 30 years ago while in [place] area she had a cough and then a chest x-ray showed something.  She was then admitted to the hospital had a bronchoscopy and apparently and the biopsy showed sarcoidosis.  She took steroids for 6 months and then after that the pulmonologist told her she was "done".  Then she was doing fine without any respiratory issues.  Then approximately 15 years ago went to [place] to see her mom and shortly after that just before her return she got sick with respiratory issues.  When she arrived in the room she was admitted to the hospital for 7 days.  She does not know any details.  She says the doctor never give any details.  She does not remember being on oxygen.  She was discharged on steroids which she took for "short while".  She was told that the steroids clouded her diagnosis.  No specific etiology was found but then she got better after the steroids course was done.  And she been doing fine ever since.  Then approximately 6-week ago she had some benefit surgery to her left side.  This was around the hip to remove some scar.  Some week or 2 after that for the last 4 to 6 weeks she has had cough and shortness of breath.  Shortness of breath is present on exertion.  There is no wheezing or proximal nocturnal dyspnea or orthopnea.  She is now on prednisone for the last 10 days.  With this the cough  is resolved but the still shortness of breath on exertion relieved by rest.  She has no past history of pulmonary embolism or cancer connective tissue disease.  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  In my personal visualization this was also present in the lung images of an abdominal CT from September 2020.  There are some elevation of the right hemidiaphragm.  She is noted to be on losartan.  Of note she is quite active and even does some gardening work.   SYMPTOM SCALE - ILD 10/18/2019   O2 use ra  Shortness of Breath 0 -> 5 scale with 5 being worst (score 6 If unable to do)  At rest 0  Simple tasks - showers, clothes change, eating, shaving 1  Household (dishes, doing bed, laundry) 1  Shopping 1  Walking level at own pace 2  Walking up Stairs *3  Total (30-36) Dyspnea Score 8  How bad is your cough? 0  How bad is your fatigue 2  How bad is nausea 0  How bad is vomiting?  0  How bad is diarrhea? 0  How bad is anxiety? 0  How bad is depression 0       Simple office walk 185 feet x  3 laps  goal with forehead probe 10/18/2019   O2 used ra  Number laps completed 3  Comments about pace moder pace - briks  Resting Pulse Ox/HR 96% and 89/min  Final Pulse Ox/HR 96% and 98/min  Desaturated </= 88% no  Desaturated <= 3% points no  Got Tachycardic >/= 90/min yes  Symptoms at end of test No dyspnea, just tired  Miscellaneous comments x      ROS - per HPI     has a past medical history of Cervical pain and Hyperglycemia.   reports that she has never smoked. She has never used smokeless tobacco.  Past Surgical History:  Procedure Laterality Date  . SHOULDER SURGERY      No Known Allergies  Immunization History  Administered Date(s) Administered  . Janssen (J&J) SARS-COV-2 Vaccination 05/12/2019    No family history on file.   Current Outpatient Medications:  .  Estrogens Conjugated (PREMARIN PO), Take  by mouth., Disp: , Rfl:  .  GLIPIZIDE PO, Take by mouth. Only occasionally, Disp: , Rfl:  .  meclizine (ANTIVERT) 12.5 MG tablet, Take 1 tablet (12.5 mg total) by mouth 3 (three) times daily as needed for dizziness., Disp: 30 tablet, Rfl: 0      Objective:   Vitals:   10/18/19 1154  BP: 126/72  Pulse: 74  Temp: (!) 96.1 F (35.6 C)  SpO2: 97%  Weight: 160 lb (72.6 kg)  Height: '5\' 1"'  (1.549 m)    Estimated body mass index is 30.23 kg/m as calculated from the following:   Height as of this encounter: '5\' 1"'  (1.549 m).   Weight as of this encounter: 160 lb (72.6 kg).  '@WEIGHTCHANGE' @  [employer]   10/18/19 1154  Weight: 160 lb (72.6 kg)     Physical Exam  General Appearance:    Alert, cooperative, no distress, appears stated age - younger , Deconditioned looking - no , OBESE  - yes, Sitting on Wheelchair -  no  Head:    Normocephalic, without obvious abnormality, atraumatic  Eyes:    PERRL, conjunctiva/corneas clear,  Ears:    Normal TM's and external ear canals, both ears  Nose:   Nares normal, septum midline, mucosa normal, no drainage    or sinus tenderness. OXYGEN ON  - no . Patient is @ ra   Throat:   Lips, mucosa, and tongue normal; teeth and gums normal. Cyanosis on lips - no  Neck:   Supple, symmetrical, trachea midline, no adenopathy;    thyroid:  no enlargement/tenderness/nodules; no carotid   bruit or JVD  Back:     Symmetric, no curvature, ROM normal, no CVA tenderness  Lungs:     Distress - no , Wheeze no, Barrell Chest - no, Purse lip breathing - no, Crackles - no -r  Chest Wall:    No tenderness or deformity.    Heart:    Regular rate and rhythm, S1 and S2 normal, no rub   or gallop, Murmur - no  Breast Exam:    NOT DONE  Abdomen:     Soft, non-tender, bowel sounds active all four quadrants,    no masses, no organomegaly. Visceral obesity - yes  Genitalia:   NOT DONE  Rectal:   NOT DONE  Extremities:   Extremities - normal, Has Cane - no, Clubbing -  no, Edema - no  Pulses:   2+ and symmetric all extremities  Skin:   Stigmata of Connective Tissue Disease - no  Lymph nodes:  Cervical, supraclavicular, and axillary nodes normal  Psychiatric:  Neurologic:   Pleasant - yes, Anxious - no, Flat affect - no  CAm-ICU - neg, Alert and Oriented x 3 - yes, Moves all 4s - yes, Speech - normal, Cognition - intact           Assessment:       ICD-10-CM   1. ILD (interstitial lung disease) ([place])  J84.9   2. Pulmonary fibrosis ([place])  J84.10   3. Cough  R05   4. Dyspnea on exertion  R06.00        Plan:     Patient Instructions     ICD-10-CM   1. ILD (interstitial lung disease) ([place])  J84.9   2. Pulmonary fibrosis ([place])  J84.10   3. Cough  R05   4. Dyspnea on exertion  R06.00      - I am concerned you  have Interstitial Lung Disease (ILD)  -  There are MANY varieties of this - To narrow down possibilities and assess severity please do the following  - fill out ILD questionnaire 10/18/2019 and give to cMA/RN 10/18/2019 itself  - stop prednisone today  -  do  Full PFT in 4-8 weeks  - do autoimmune panel 10/18/2019- : Serum: ESR, ANA, DS-DNA, RF, anti-CCP, ssA, ssB, scl-70, ANCA, MPO and PR-3 antibodies, Total CK,  Aldolase,  Hypersensitivity Pneumonitis Panel   Followup  - 4- 8 weeks with Dr Chase Caller for 30 mion visit on any day to discuss    ( Level 05 visit:  New 60-74 min  visit type: on-site physical face to visit  in total care time and counseling or/and coordination of care by this undersigned MD - Dr Brand Males. This includes one or more of the following on this same day 10/18/2019: pre-charting, chart review, note writing, documentation discussion of test results, diagnostic or treatment recommendations, prognosis, risks and benefits of management options, instructions, education, compliance or risk-factor reduction. It excludes time spent by the [place] or office staff in the care of the patient. Actual time 76  min)   SIGNATURE    Dr. Brand Males, M.D., F.C.C.P,  Pulmonary and Critical Care Medicine Staff Physician, [place] Director - Interstitial Lung Disease  Program  [facility] [place], [place], [address]  Pager: [phone], If no answer or between  15:00h - 7:00h: call [phone] Telephone: [phone]  12:25 PM 10/18/2019

## 2019-10-18 NOTE — Patient Instructions (Addendum)
ICD-10-CM   1. ILD (interstitial lung disease) ([place])  J84.9   2. Pulmonary fibrosis ([place])  J84.10   3. Cough  R05   4. Dyspnea on exertion  R06.00      - I am concerned you  have Interstitial Lung Disease (ILD)  -  There are MANY varieties of this - To narrow down possibilities and assess severity please do the following  - fill out ILD questionnaire 10/18/2019 and give to cMA/RN 10/18/2019 itself  - stop prednisone today  -  do  Full PFT in 4-8 weeks  - do autoimmune panel 10/18/2019- : Serum: ESR, ANA, DS-DNA, RF, anti-CCP, ssA, ssB, scl-70, ANCA, MPO and PR-3 antibodies, Total CK,  Aldolase,  Hypersensitivity Pneumonitis Panel   Followup  - 4- 8 weeks with Dr Chase Caller for 30 mion visit on any day to discuss  - return sooner if cough returns

## 2019-10-20 LAB — RHEUMATOID FACTOR: Rhuematoid fact SerPl-aCnc: <14 IU/mL (ref <14)

## 2019-10-20 LAB — ANTI-DNA ANTIBODY, DOUBLE-STRANDED: ds DNA Ab: 1 IU/mL

## 2019-10-20 LAB — ANCA SCREEN W REFLEX TITER: ANCA Screen: NEGATIVE

## 2019-10-20 LAB — MPO/PR-3 (ANCA) ANTIBODIES
Myeloperoxidase Abs: <1 AI
Serine Protease 3: <1 AI

## 2019-10-20 LAB — ANA: Anti Nuclear Antibody (ANA): POSITIVE — AB

## 2019-10-20 LAB — ALDOLASE: Aldolase: 5.1 U/L (ref <=8.1)

## 2019-10-20 LAB — CYCLIC CITRUL PEPTIDE ANTIBODY, IGG: Cyclic Citrullin Peptide Ab: <16 UNITS

## 2019-10-20 LAB — CK TOTAL AND CKMB (NOT AT ARMC)
CK, MB: 2.6 ng/mL (ref 0–5.0)
Relative Index: 3.3 (ref 0–4.0)
Total CK: 78 U/L (ref 29–143)

## 2019-10-20 LAB — ANTI-NUCLEAR AB-TITER (ANA TITER): ANA Titer 1: 1:80 {titer} — ABNORMAL HIGH

## 2019-10-20 LAB — SJOGRENS SYNDROME-A EXTRACTABLE NUCLEAR ANTIBODY: SSA (Ro) (ENA) Antibody, IgG: <1 AI

## 2019-10-20 LAB — ANTI-SCLERODERMA ANTIBODY: Scleroderma (Scl-70) (ENA) Antibody, IgG: <1 AI

## 2019-10-20 LAB — SJOGRENS SYNDROME-B EXTRACTABLE NUCLEAR ANTIBODY: SSB (La) (ENA) Antibody, IgG: <1 AI

## 2019-10-23 ENCOUNTER — Telehealth: Payer: Self-pay | Admitting: Internal Medicine

## 2019-10-23 LAB — HYPERSENSITIVITY PNEUMONITIS
A. Pullulans Abs: NEGATIVE
A.Fumigatus #1 Abs: NEGATIVE
Micropolyspora faeni, IgG: NEGATIVE
Pigeon Serum Abs: NEGATIVE
Thermoact. Saccharii: NEGATIVE
Thermoactinomyces vulgaris, IgG: NEGATIVE

## 2019-10-23 NOTE — Telephone Encounter (Signed)
  Auotimmune, vasculitis, ace for sarcoid and HP -pnael all negative. Ensure followup  Results for TYLIE, GOLONKA (MRN [id number]) as of 10/23/2019 17:44  Ref. Range 10/18/2019 12:39  CK Total Latest Ref Range: 29.0 - 143.0 U/L 78  CK, MB Latest Ref Range: 0 - 5.0 ng/mL 2.6  Aldolase Latest Ref Range: < OR = 8.1 U/L 5.1  Sed Rate Latest Ref Range: 0 - 30 mm/hr 15  Anti Nuclear Antibody (ANA) Latest Ref Range: NEGATIVE  POSITIVE (A)  ANA Pattern 1 Unknown Nuclear, Speckled (A)  ANA Titer 1 Latest Units: titer 1:80 (H)  Cyclic Citrullin Peptide Ab Latest Units: UNITS <16  ds DNA Ab Latest Units: IU/mL 1  Myeloperoxidase Abs Latest Units: AI <1.0  Serine Protease 3 Latest Units: AI <1.0  RA Latex Turbid. Latest Ref Range: <14 IU/mL <14  SSA (Ro) (ENA) Antibody, IgG Latest Ref Range: <1.0 NEG AI <1.0 NEG  SSB (La) (ENA) Antibody, IgG Latest Ref Range: <1.0 NEG AI <1.0 NEG  Scleroderma (Scl-70) (ENA) Antibody, IgG Latest Ref Range: <1.0 NEG AI <1.0 NEG  A.Fumigatus #1 Abs Latest Ref Range: Negative  Negative  Micropolyspora faeni, IgG Latest Ref Range: Negative  Negative  Thermoactinomyces vulgaris, IgG Latest Ref Range: Negative  Negative  A. Pullulans Abs Latest Ref Range: Negative  Negative  Thermoact. Saccharii Latest Ref Range: Negative  Negative  [place] Serum Abs Latest Ref Range: Negative  Negative

## 2019-10-26 NOTE — Telephone Encounter (Signed)
Plan:     Patient Instructions     ICD-10-CM   1. ILD (interstitial lung disease) ([place])  J84.9   2. Pulmonary fibrosis ([place])  J84.10   3. Cough  R05   4. Dyspnea on exertion  R06.00      - I am concerned you  have Interstitial Lung Disease (ILD)  -  There are MANY varieties of this - To narrow down possibilities and assess severity please do the following             - fill out ILD questionnaire 10/18/2019 and give to cMA/RN 10/18/2019 itself             - stop prednisone today             -  do  Full PFT in 4-8 weeks             - do autoimmune panel 10/18/2019- : Serum: ESR, ANA, DS-DNA, RF, anti-CCP, ssA, ssB, scl-70, ANCA, MPO and PR-3 antibodies, Total CK,  Aldolase,  Hypersensitivity Pneumonitis Panel   Followup  - 4- 8 weeks with Dr Chase Caller for 30 mion visit on any day to discuss     Called and spoke with pt letting her know the results of labwork and she verbalized understanding. Pt has been scheduled for PFT appt and OV with Aaron Edelman 9/27 to discuss results.nothing further needed.

## 2019-11-27 ENCOUNTER — Other Ambulatory Visit: Payer: Self-pay

## 2019-11-27 ENCOUNTER — Ambulatory Visit (INDEPENDENT_AMBULATORY_CARE_PROVIDER_SITE_OTHER): Payer: Medicare PPO | Admitting: Internal Medicine

## 2019-11-27 ENCOUNTER — Ambulatory Visit (INDEPENDENT_AMBULATORY_CARE_PROVIDER_SITE_OTHER): Payer: Medicare PPO | Admitting: Pulmonary Disease

## 2019-11-27 ENCOUNTER — Encounter: Payer: Self-pay | Admitting: Pulmonary Disease

## 2019-11-27 VITALS — BP 120/70 | HR 92 | Temp 97.1°F | Ht 61.0 in | Wt 163.4 lb

## 2019-11-27 DIAGNOSIS — J849 Interstitial pulmonary disease, unspecified: Secondary | ICD-10-CM | POA: Diagnosis not present

## 2019-11-27 DIAGNOSIS — R768 Other specified abnormal immunological findings in serum: Secondary | ICD-10-CM | POA: Insufficient documentation

## 2019-11-27 DIAGNOSIS — Z0000 Encounter for general adult medical examination without abnormal findings: Secondary | ICD-10-CM

## 2019-11-27 LAB — PULMONARY FUNCTION TEST
DL/VA % pred: 77 %
DL/VA: 3.24 ml/min/mmHg/L
DLCO cor % pred: 73 %
DLCO cor: 12.41 ml/min/mmHg
DLCO unc % pred: 73 %
DLCO unc: 12.41 ml/min/mmHg
FEF 25-75 Post: 3.15 L/sec
FEF 25-75 Pre: 2.01 L/sec
FEF2575-%Change-Post: 56 %
FEF2575-%Pred-Post: 254 %
FEF2575-%Pred-Pre: 162 %
FEV1-%Change-Post: 11 %
FEV1-%Pred-Post: 109 %
FEV1-%Pred-Pre: 98 %
FEV1-Post: 1.83 L
FEV1-Pre: 1.63 L
FEV1FVC-%Change-Post: 4 %
FEV1FVC-%Pred-Pre: 113 %
FEV6-%Change-Post: 6 %
FEV6-%Pred-Post: 98 %
FEV6-%Pred-Pre: 92 %
FEV6-Post: 2.08 L
FEV6-Pre: 1.95 L
FEV6FVC-%Pred-Post: 106 %
FEV6FVC-%Pred-Pre: 106 %
FVC-%Change-Post: 6 %
FVC-%Pred-Post: 92 %
FVC-%Pred-Pre: 86 %
FVC-Post: 2.08 L
FVC-Pre: 1.95 L
Post FEV1/FVC ratio: 88 %
Post FEV6/FVC ratio: 100 %
Pre FEV1/FVC ratio: 84 %
Pre FEV6/FVC Ratio: 100 %
RV % pred: 82 %
RV: 1.84 L
TLC % pred: 83 %
TLC: 3.82 L

## 2019-11-27 NOTE — Assessment & Plan Note (Signed)
Patient reports previous evaluations by rheumatology over 10 years ago in [place] area She reports multiple elevations Recent connective tissue labs negative with the exception of a positive ANA with a mildly elevated titer 1: 80  Plan: Referral to rheumatology

## 2019-11-27 NOTE — Progress Notes (Signed)
PFT done today. 

## 2019-11-27 NOTE — Assessment & Plan Note (Signed)
Up-to-date on flu vaccine Up-to-date on COVID-19 vaccinations

## 2019-11-27 NOTE — Progress Notes (Signed)
@Patient  ID: , female    DOB: 15-Dec-1940, 79 y.o.   MRN: [id number]  Chief Complaint  Patient presents with  . Follow-up    pft results    Referring provider: [id number], MD  HPI:  79 year old female never smoker followed in our office for interstitial lung disease  PMH: Type 2 diabetes Smoker/ Smoking History: Never smoker Maintenance:  none Pt of: Dr. 96  11/27/2019  - Visit   79 year old female never smoker followed in our office for interstitial lung disease. She was last seen for initial consult in August/2021 by Dr. September/2021. Plan of care from that office visit was as follows:  Fill out ILD questionnaire, stop prednisone today, do full pulmonary function testing, obtain autoimmune panel, follow-up in 4 to 8 weeks with Dr. Marchelle Gearing.  Patient's connective tissue lab work was essentially negative. Mildly elevated ANA. Titer of 1: 80, reviewed by Dr. Marchelle Gearing and felt to be of no clinical significance. Patient presenting today after completing pulmonary function testing. Those results are listed below:  11/27/2019-pulmonary function test-FVC 1.95 (86% predicted), postbronchodilator ratio 88, postbronchodilator FEV1 1.83 (109% predicted), no bronchodilator response, TLC 3.82 (83% predicted), DLCO 12.41 (73% predicted), lower level of normal DLCO 14.15  Patient continues to report left abdominal side pain as well as left lower extremity leg pain numbness and tingling.  She plans to follow-up with primary care tomorrow.  She reports the numbness and tingling is worse at night.  She does have type 2 diabetes.  She reports that her blood sugars have been controlled but they have been persistently elevated in the past due to recurrent prednisone use.  She was recently prescribed Lasix by primary care but she did not notice much improvement.    She has an appointment with primary care tomorrow Dr. 11/29/2019 to discuss this.  Patient is up-to-date with COVID-19  vaccinations as well as flu vaccine.  Patient reports that she is filled out the ILD questionnaire, and she dropped this back off at our office.  Walked in office today was able to complete 2 laps with oxygen levels dropping to 88% on room air, patient did not require oxygen  Tests:   10/18/2019-connective tissue labs-positive ANA, ANA titer 1: 80, nuclear, speckled Rest of connective tissue labs negative  09/25/2019-CT chest without contrast-mild bibasilar predominant irregular peripheral interstitial and groundglass opacity generally nonspecific and not characteristics and appearance of fibrotic pulmonary sarcoidosis, indeterminate for UIP pattern, consider follow-up ILD protocol CT chest in 1 year to observe stability of fibrotic findings, occasional scattered calcified and benign small pulmonary nodules, elevation of right hemidiaphragm, 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  11/27/2019-pulmonary function test-FVC 1.95 (86% predicted), postbronchodilator ratio 88, postbronchodilator FEV1 1.83 (109% predicted), no bronchodilator response, TLC 3.82 (83% predicted), DLCO 12.41 (73% predicted), lower level of normal DLCO 14.15  FENO:  No results found for: NITRICOXIDE  PFT: PFT Results Latest Ref Rng & Units 11/27/2019  FVC-Pre L 1.95  FVC-Predicted Pre % 86  FVC-Post L 2.08  FVC-Predicted Post % 92  Pre FEV1/FVC % % 84  Post FEV1/FCV % % 88  FEV1-Pre L 1.63  FEV1-Predicted Pre % 98  FEV1-Post L 1.83  DLCO uncorrected ml/min/mmHg 12.41  DLCO UNC% % 73  DLCO corrected ml/min/mmHg 12.41  DLCO COR %Predicted % 73  DLVA Predicted % 77  TLC L 3.82  TLC % Predicted % 83  RV % Predicted % 82  WALK:  SIX MIN WALK 10/18/2019  Supplimental Oxygen during Test? (L/min) No  Tech Comments: moderate pace. reports feeling fatigued with walking but denies shortness of breath.    Imaging: No results found.  Lab Results:  CBC     Component Value Date/Time   HGB 15.3 (H) 10/09/2017 1724   HCT 45.0 10/09/2017 1724    BMET    Component Value Date/Time   NA 134 (L) 10/09/2017 1724   K 3.8 10/09/2017 1724   CL 102 10/09/2017 1724   GLUCOSE 174 (H) 10/09/2017 1724   BUN 14 10/09/2017 1724   CREATININE 0.50 10/09/2017 1724    BNP No results found for: BNP  ProBNP No results found for: PROBNP  Specialty Problems      Pulmonary Problems   ILD (interstitial lung disease) (HCC)      No Known Allergies  Immunization History  Administered Date(s) Administered  . Fluad Quad(high Dose 65+) 11/13/2019  . Janssen (J&J) SARS-COV-2 Vaccination 05/12/2019  . Pneumococcal Conjugate-13 11/13/2019    Past Medical History:  Diagnosis Date  . Cervical pain   . Hyperglycemia     Tobacco History: Social History   Tobacco Use  Smoking Status Never Smoker  Smokeless Tobacco Never Used   Counseling given: Not Answered   Continue to not smoke  Outpatient Encounter Medications as of 11/27/2019  Medication Sig  . Estrogens Conjugated (PREMARIN PO) Take by mouth.  . EYSUVIS 0.25 % SUSP   . furosemide (LASIX) 20 MG tablet   . GLIPIZIDE PO Take by mouth. Only occasionally  . meclizine (ANTIVERT) 12.5 MG tablet Take 1 tablet (12.5 mg total) by mouth 3 (three) times daily as needed for dizziness.  . Vitamin D, Ergocalciferol, (DRISDOL) 1.25 MG (50000 UNIT) CAPS capsule    No facility-administered encounter medications on file as of 11/27/2019.     Review of Systems  Review of Systems  Constitutional: Positive for fatigue. Negative for activity change and fever.  HENT: Negative for sinus pressure, sinus pain and sore throat.   Respiratory: Positive for cough (dry cough in am ). Negative for shortness of breath and wheezing.   Cardiovascular: Negative for chest pain and palpitations.  Gastrointestinal: Negative for diarrhea, nausea and vomiting.  Musculoskeletal: Negative for arthralgias.  Neurological:  Positive for numbness (LE tingling numbness). Negative for dizziness.  Hematological: Bruises/bleeds easily.  Psychiatric/Behavioral: Negative for sleep disturbance. The patient is not nervous/anxious.      Physical Exam  BP 120/70 (BP Location: Left Arm, Patient Position: Sitting, Cuff Size: Normal)   Pulse 92   Temp (!) 97.1 F (36.2 C) (Temporal)   Ht 5\' 1"  (1.549 m)   Wt 163 lb 6.4 oz (74.1 kg)   SpO2 96%   BMI 30.87 kg/m   Wt Readings from Last 5 Encounters:  11/27/19 163 lb 6.4 oz (74.1 kg)  10/18/19 160 lb (72.6 kg)    BMI Readings from Last 5 Encounters:  11/27/19 30.87 kg/m  10/18/19 30.23 kg/m     Physical Exam Vitals and nursing note reviewed.  Constitutional:      General: She is not in acute distress.    Appearance: Normal appearance. She is normal weight.  HENT:     Head: Normocephalic and atraumatic.     Right Ear: Tympanic membrane, ear canal and external ear normal. There is no impacted cerumen.     Left Ear: Tympanic membrane, ear canal and external ear normal. There is no impacted cerumen.  Nose: Rhinorrhea present. No congestion.     Mouth/Throat:     Mouth: Mucous membranes are moist.     Pharynx: Oropharynx is clear.  Eyes:     Pupils: Pupils are equal, round, and reactive to light.  Cardiovascular:     Rate and Rhythm: Normal rate and regular rhythm.     Pulses: Normal pulses.     Heart sounds: Normal heart sounds. No murmur heard.   Pulmonary:     Effort: Pulmonary effort is normal. No respiratory distress.     Breath sounds: No decreased air movement. Rales (bibasilar rales ) present. No decreased breath sounds or wheezing.  Musculoskeletal:     Cervical back: Normal range of motion.     Right lower leg: Edema present.     Left lower leg: Edema present.  Skin:    General: Skin is warm and dry.     Capillary Refill: Capillary refill takes less than 2 seconds.  Neurological:     General: No focal deficit present.     Mental  Status: She is alert and oriented to person, place, and time. Mental status is at baseline.     Gait: Gait normal.  Psychiatric:        Mood and Affect: Mood normal.        Behavior: Behavior normal.        Thought Content: Thought content normal.        Judgment: Judgment normal.       Assessment & Plan:   ILD (interstitial lung disease) (HCC) Plan: Walk today in office Referral to rheumatology Follow-up with Dr. Marchelle Gearing in 4 months Spirometry with DLCO to be completed in March/2022 High-resolution CT chest to be completed in July/2022 We do not see a completed ILD questionnaire on file with our office, we will give the patient another one today   Positive ANA (antinuclear antibody) Patient reports previous evaluations by rheumatology over 10 years ago in [place] area She reports multiple elevations Recent connective tissue labs negative with the exception of a positive ANA with a mildly elevated titer 1: 80  Plan: Referral to rheumatology  Healthcare maintenance Up-to-date on flu vaccine Up-to-date on COVID-19 vaccinations    Return in about 4 months (around 03/28/2020), or if symptoms worsen or fail to improve, for Follow up with Dr. Dalbert Mayotte, ILD clinic - slot.   Coral Ceo, NP 11/27/2019   This appointment required 32 minutes of patient care (this includes precharting, chart review, review of results, face-to-face care, etc.).

## 2019-11-27 NOTE — Patient Instructions (Addendum)
You were seen today by Coral Ceo, NP  for:   1. ILD (interstitial lung disease) (HCC)  - CT Chest High Resolution; Future - Pulmonary function test; Future  We will repeat a 30-minute breathing test in March/2022  We will plan on repeating a CT chest high-resolution in July/2022  Walk today in office >>> Tolerated walk in office 2 laps, was able to maintain oxygen saturations at 88% or above   2. Positive ANA (antinuclear antibody)  - Ambulatory referral to Rheumatology  He had a positive ANA with a mildly elevated titer.  This could nothing.  I will refer you to rheumatology though for evaluation given your history of previous rheumatology eval over 10 years ago as well as current myalgias and interstitial lung disease/scarring on CT imaging.   3. Healthcare maintenance  Great work being up-to-date on your flu vaccine as well as your COVID-19 vaccination   We recommend today:  Orders Placed This Encounter  Procedures  . CT Chest High Resolution    -High-res CT with supine and prone positioning -inspiratory and expiratory cuts.  -Only to be read by Dr. Fredirick Lathe and Dr. Llana Aliment.    Standing Status:   Future    Standing Expiration Date:   11/26/2020    Scheduling Instructions:     Complete in July/2022    Order Specific Question:   Preferred imaging location?    Answer:   [facility]    Order Specific Question:   Radiology Contrast Protocol - do NOT remove file path    Answer:   [email or web address]  . Ambulatory referral to Rheumatology    Referral Priority:   Routine    Referral Type:   Consultation    Referral Reason:   Specialty Services Required    Requested Specialty:   Rheumatology    Number of Visits Requested:   1  . Pulmonary function test    March / 2022    Standing Status:   Future    Standing Expiration Date:   11/26/2020    Scheduling Instructions:     Cleda Daub with dlco    Order Specific Question:   Where  should this test be performed?    Answer:   [facility]   Orders Placed This Encounter  Procedures  . CT Chest High Resolution  . Ambulatory referral to Rheumatology  . Pulmonary function test   No orders of the defined types were placed in this encounter.   Follow Up:    Return in about 4 months (around 03/28/2020), or if symptoms worsen or fail to improve, for Follow up with Dr. Dalbert Mayotte, ILD clinic - slot.   Notification of test results are managed in the following manner: If there are  any recommendations or changes to the  plan of care discussed in office today,  we will contact you and let you know what they are. If you do not hear from [place], then your results are normal and you can view them through your  MyChart account , or a letter will be sent to you. Thank you again for trusting [place] with your care  - Thank you,  Pulmonary    It is flu season:   >>> Best ways to protect herself from the flu: Receive the yearly flu vaccine, practice good hand hygiene washing with soap and also using hand sanitizer when available, eat a nutritious meals, get adequate rest, hydrate appropriately       Please contact  the office if your symptoms worsen or you have concerns that you are not improving.   Thank you for choosing [facility] for your healthcare, and for allowing [place] to partner with you on your healthcare journey. I am thankful to be able to provide care to you today.   Wyn Quaker FNP-C

## 2019-11-27 NOTE — Assessment & Plan Note (Addendum)
Plan: Walk today in office Referral to rheumatology Follow-up with Dr. Marchelle Gearing in 4 months Spirometry with DLCO to be completed in March/2022 High-resolution CT chest to be completed in July/2022 We do not see a completed ILD questionnaire on file with our office, we will give the patient another one today

## 2020-01-03 ENCOUNTER — Encounter: Payer: Self-pay | Admitting: Internal Medicine

## 2020-01-03 ENCOUNTER — Ambulatory Visit (INDEPENDENT_AMBULATORY_CARE_PROVIDER_SITE_OTHER): Payer: Medicare PPO | Admitting: Internal Medicine

## 2020-01-03 ENCOUNTER — Other Ambulatory Visit: Payer: Self-pay

## 2020-01-03 VITALS — BP 120/74 | HR 70 | Ht 60.5 in | Wt 162.2 lb

## 2020-01-03 DIAGNOSIS — M25562 Pain in left knee: Secondary | ICD-10-CM | POA: Insufficient documentation

## 2020-01-03 DIAGNOSIS — J849 Interstitial pulmonary disease, unspecified: Secondary | ICD-10-CM | POA: Diagnosis not present

## 2020-01-03 DIAGNOSIS — R768 Other specified abnormal immunological findings in serum: Secondary | ICD-10-CM

## 2020-01-03 DIAGNOSIS — G8929 Other chronic pain: Secondary | ICD-10-CM

## 2020-01-03 DIAGNOSIS — M25552 Pain in left hip: Secondary | ICD-10-CM | POA: Insufficient documentation

## 2020-01-03 NOTE — Progress Notes (Signed)
Office Visit Note  Patient: Adriana Donovan             Date of Birth: 1941-02-25           MRN: [id number]             PCP: Michael Boston, MD Referring: Lauraine Rinne, NP Visit Date: 01/03/2020  Subjective:  New Patient (Initial Visit)   History of Present Illness: Adriana Donovan is a 79 y.o. female here for evaluation of positive ANA checked during evaluation of ILD and with a history of sarcoidosis. She was treated temporarily with prednisone and at that time felt to have self limited course of sarcoidosis first occurred in [place] about 30 years ago.  She had another episode about 15 years ago she was prescribed a course of steroids again after a week in the hospital due to respiratory symptoms but was not given the clear diagnosis.  She does not have any chronic symptoms but in June or July of this year started having coughing and shortness of breath on exertion that concerned her as being similar to previous symptoms and so she was referred to see pulmonology. She saw Dr. 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 PFTs demonstrated a reduction of DLCO to 70%s and is currently planning for follow up with repeat studies next year. Lab studies showed positive ANA 1:80 but negative ENA panel. From that visit she reports cough complaints were thought to be more from upper airway more than lower airway disease as a cause of symptoms. Besides her lungs which she currently has very little complaint about her main issue is pain affecting the left low back hip and knee and bilateral shins.  She has been having pain in his area for years with multiple evaluations and treatments by orthopedist.  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  She does not tolerate repeated cortisone shots due to getting significant hyperglycemia.   She is also having a lot of pain in the left low back and left hip.  She states her left pelvis was previously thought to be a "torn bone" not recommended for surgical treatment by an orthopedist.  She reports 2 surgeries on the left hip for debridement of scar tissue.  He is also had steroid injection into the hip joint that alleviates symptoms for less than 2 weeks.  Currently the pain is worse around the lateral posterior upper area of the hip and in the left low back bothers her most when lying in bed.  The pain in her left knee bothers her most when walking on it and especially cannot tolerate climbing staircases.  She is also having pain over both shins anteriorly that feels extremely tender at night.  There is overlying swelling with dependent edema throughout the day and she wears supportive socks for this.   Labs reviewed 10/2019 Hypersensitivity pneumonitis panel negative Aldolase 5.1 CK 78 ANCA negative SSA negative SSB negative CCP negative dsDNA negative Rheumatoid factor negative ESR 15 ANA 1:80 nuclear, speckled  Imaging reviewed 08/2019 CT Chest w/o contrast  "There is mild, bibasilar predominant irregular peripheral interstitial and groundglass opacity, generally nonspecific and not characteristic in appearance for fibrotic pulmonary sarcoidosis.  If characterized by ATS pulmonary fibrosis criteria, these findings are and "indeterminate for UIP" pattern.  Consider follow-up ILD protocol CT in  1 year to observe for stability of fibrotic findings and pattern if there is clinical concern for fibrotic interstitial lung disease."  Pulmonary function test FVC 86% FEV1 98% DLCO 73%   Activities of Daily Living:  Patient reports morning stiffness for 1-6 hours.   Patient Reports nocturnal pain.  Difficulty dressing/grooming: Denies Difficulty climbing stairs: Denies Difficulty getting out of chair: Denies Difficulty using hands for taps, buttons, cutlery, and/or writing:  Denies  Review of Systems  Constitutional: Positive for fatigue.  HENT: Negative for mouth sores, mouth dryness and nose dryness.   Eyes: Negative for pain, itching, visual disturbance and dryness.  Respiratory: Positive for shortness of breath. Negative for cough, hemoptysis and difficulty breathing.   Cardiovascular: Positive for palpitations. Negative for chest pain and swelling in legs/feet.  Gastrointestinal: Negative for abdominal pain, blood in stool, constipation and diarrhea.  Endocrine: Negative for increased urination.  Genitourinary: Negative for painful urination.  Musculoskeletal: Positive for arthralgias, joint pain, joint swelling, myalgias, morning stiffness and myalgias. Negative for muscle weakness and muscle tenderness.  Skin: Negative for color change, rash and redness.  Allergic/Immunologic: Negative for susceptible to infections.  Neurological: Positive for weakness. Negative for dizziness, numbness, headaches and memory loss.  Hematological: Negative for swollen glands.  Psychiatric/Behavioral: Positive for sleep disturbance. Negative for confusion.    PMFS History:  Patient Active Problem List   Diagnosis Date Noted  . Pain in left hip 01/03/2020  . Pain in left knee 01/03/2020  . Positive ANA (antinuclear antibody) 11/27/2019  . ILD (interstitial lung disease) ([place]) 11/27/2019  . Healthcare maintenance 11/27/2019    Past Medical History:  Diagnosis Date  . Arthritis   . Cervical pain   . Hyperglycemia     Family History  Problem Relation Age of Onset  . Diabetes Father   . Leukemia Brother    Past Surgical History:  Procedure Laterality Date  . ABDOMINAL HYSTERECTOMY     Per patient  . SHOULDER SURGERY     Social History   Social History Narrative  . Not on file   Immunization History  Administered Date(s) Administered  . Fluad Quad(high Dose 65+) 11/13/2019  . Janssen (J&J) SARS-COV-2 Vaccination 05/12/2019  . Moderna SARS-COVID-2  Vaccination 12/24/2019  . Pneumococcal Conjugate-13 11/13/2019     Objective: Vital Signs: BP 120/74 (BP Location: Left Arm, Patient Position: Sitting, Cuff Size: Small)   Pulse 70   Ht 5' 0.5" (1.537 m)   Wt 162 lb 3.2 oz (73.6 kg)   BMI 31.16 kg/m    Physical Exam HENT:     Right Ear: External ear normal.     Left Ear: External ear normal.     Mouth/Throat:     Mouth: Mucous membranes are moist.     Pharynx: Oropharynx is clear.  Eyes:     Extraocular Movements: Extraocular movements intact.     Conjunctiva/sclera: Conjunctivae normal.  Cardiovascular:     Rate and Rhythm: Normal rate and regular rhythm.  Pulmonary:     Effort: Pulmonary effort is normal.     Comments: Bibasilar inspiratory crackles Skin:    General: Skin is warm and dry.     Findings: No rash.  Neurological:     General: No focal deficit present.     Mental Status: She is alert.     Musculoskeletal Exam:  Neck full range of motion no tenderness Shoulder, elbow, wrist, fingers full range of motion no tenderness or swelling Left-sided paraspinal muscle tenderness extending laterally above  the posterior superior iliac spine Hip internal and external rotation provoke lateral and superior hip pain on the left side worse with external rotation, no tenderness to palpation over the greater trochanter Left knee patellofemoral crepitus present extension flexion range of motion are slightly reduced and medial joint line tenderness to pressure no swelling or warmth appreciated, right knee no tenderness or swelling  Tenderness to pressure over anterior tibia bilaterally without significant swelling or skin changes  CDAI Exam: CDAI Score: -- Patient Global: --; Provider Global: -- Swollen: --; Tender: -- Joint Exam 01/03/2020   No joint exam has been documented for this visit   There is currently no information documented on the homunculus. Go to the Rheumatology activity and complete the homunculus joint  exam.  Investigation: No additional findings.  Imaging: No results found.  Recent Labs: Lab Results  Component Value Date   HGB 15.3 (H) 10/09/2017   NA 134 (L) 10/09/2017   K 3.8 10/09/2017   CL 102 10/09/2017   GLUCOSE 174 (H) 10/09/2017   BUN 14 10/09/2017   CREATININE 0.50 10/09/2017    Speciality Comments: No specialty comments available.  Procedures:  No procedures performed Allergies: Patient has no known allergies.   Assessment / Plan:     Visit Diagnoses: Positive ANA (antinuclear antibody) ILD (interstitial lung disease) ([place])  She does not have evidence of active systemic inflammatory disease at this time.  Positive ANA 1:80 with negative ENAs can be nonspecific.  Alternatively could reflect inflammatory disease related antibody such as her previous diagnosis of sarcoidosis but does not seem clinically active at this time.  No indication for DMARD treatment at this time.  Pain in left hip Chronic pain of left knee  Left hip pain is throughout soft tissue structures definitely has some muscle spasticity.  Could be coming from problem in the bones of the pelvis or could be related to biomechanical problem coming from the severe osteoarthritis of the left knee.  She worked in physical therapy previously and says that this only made the pain worse.  She has had side effects with the previous cortisone shots and is already followed by orthopedics with whom I recommend continuing management for what looks like noninflammatory disease.    Orders: No orders of the defined types were placed in this encounter.  No orders of the defined types were placed in this encounter.   Follow-Up Instructions: Return if symptoms worsen or fail to improve.   Collier Salina, MD  Note - This record has been created using [employer].  Chart creation errors have been sought, but may not always  have been located. Such creation errors do not reflect on  the standard of  medical care.

## 2020-01-05 ENCOUNTER — Other Ambulatory Visit: Payer: Self-pay | Admitting: Internal Medicine

## 2020-01-05 DIAGNOSIS — N181 Chronic kidney disease, stage 1: Secondary | ICD-10-CM

## 2020-01-05 DIAGNOSIS — I129 Hypertensive chronic kidney disease with stage 1 through stage 4 chronic kidney disease, or unspecified chronic kidney disease: Secondary | ICD-10-CM

## 2020-01-11 ENCOUNTER — Ambulatory Visit: Payer: Medicare PPO

## 2020-01-11 ENCOUNTER — Other Ambulatory Visit: Payer: Medicare PPO

## 2020-01-11 ENCOUNTER — Other Ambulatory Visit: Payer: Self-pay

## 2020-01-11 DIAGNOSIS — I129 Hypertensive chronic kidney disease with stage 1 through stage 4 chronic kidney disease, or unspecified chronic kidney disease: Secondary | ICD-10-CM

## 2020-01-17 ENCOUNTER — Telehealth: Payer: Self-pay

## 2020-01-17 NOTE — Telephone Encounter (Signed)
I have faxed results to [facility]

## 2020-01-17 NOTE — Telephone Encounter (Signed)
They have. Please fax the results. Excerpts below.  Echocardiogram 01/11/2020:  Normal LV systolic function with EF 61%. Left ventricle cavity is normal  in size. Mild concentric hypertrophy of the left ventricle. Normal global  wall motion.  Left atrial cavity is mildly dilated.  Trileaflet aortic valve. Moderate (Grade III) aortic regurgitation.  Moderate (Grade II), posteriorly directed mitral regurgitation.  Mild tricuspid regurgitation.  No evidence of pulmonary hypertension.

## 2020-01-17 NOTE — Telephone Encounter (Signed)
Verdon Cummins from [facility] called requesting patient's echo results I don't think you have resulted it yet please advise

## 2020-03-14 ENCOUNTER — Encounter: Payer: Self-pay | Admitting: Cardiology

## 2020-03-14 ENCOUNTER — Other Ambulatory Visit: Payer: Self-pay

## 2020-03-14 ENCOUNTER — Ambulatory Visit (INDEPENDENT_AMBULATORY_CARE_PROVIDER_SITE_OTHER): Payer: Medicare PPO | Admitting: Cardiology

## 2020-03-14 VITALS — BP 123/68 | HR 87 | Ht 60.0 in | Wt 171.2 lb

## 2020-03-14 DIAGNOSIS — R0600 Dyspnea, unspecified: Secondary | ICD-10-CM

## 2020-03-14 DIAGNOSIS — Z7189 Other specified counseling: Secondary | ICD-10-CM

## 2020-03-14 DIAGNOSIS — I340 Nonrheumatic mitral (valve) insufficiency: Secondary | ICD-10-CM | POA: Diagnosis not present

## 2020-03-14 DIAGNOSIS — J849 Interstitial pulmonary disease, unspecified: Secondary | ICD-10-CM | POA: Diagnosis not present

## 2020-03-14 DIAGNOSIS — R600 Localized edema: Secondary | ICD-10-CM

## 2020-03-14 DIAGNOSIS — R609 Edema, unspecified: Secondary | ICD-10-CM

## 2020-03-14 DIAGNOSIS — R0609 Other forms of dyspnea: Secondary | ICD-10-CM

## 2020-03-14 DIAGNOSIS — I351 Nonrheumatic aortic (valve) insufficiency: Secondary | ICD-10-CM

## 2020-03-14 NOTE — Patient Instructions (Signed)
  Follow-Up: At [facility], you and your health needs are our priority.  As part of our continuing mission to provide you with exceptional heart care, we have created designated Provider Care Teams.  These Care Teams include your primary Cardiologist (physician) and Advanced Practice Providers (APPs -  Physician Assistants and Nurse Practitioners) who all work together to provide you with the care you need, when you need it.  We recommend signing up for the patient portal called "MyChart".  Sign up information is provided on this After Visit Summary.  MyChart is used to connect with patients for Virtual Visits (Telemedicine).  Patients are able to view lab/test results, encounter notes, upcoming appointments, etc.  Non-urgent messages can be sent to your provider as well.   To learn more about what you can do with MyChart, go to [email or web address].    Your next appointment:   6-8 week(s)  The format for your next appointment:   In Person  Provider:   Jodelle Red, MD

## 2020-03-14 NOTE — Progress Notes (Signed)
Cardiology Office Note:    Date:  03/14/2020   ID:  Adriana Donovan, DOB [birth year], MRN [id number]  PCP:  Melida Quitter, MD  Cardiologist:  Jodelle Red, MD  Referring MD: Melida Quitter, MD   CC: new patient evaluation for bilateral LE edema  History of Present Illness:    Adriana Donovan is a 80 y.o. female with a hx of sarcoidosis, type II diabetes, hypertension, interstitial lung disease who is seen as a new consult at the request of Nadene Rubins Nyoka Cowden, MD for the evaluation and management of bilateral LE edema.  Referral from 02/28/20 notes bilateral LE edema, chronic kidney disease state 2, type II diabetes. She had an echo done by [facility] 01/12/20 as noted below, normal EF, moderate AR, moderate MR.  Note from Dr. Nadene Rubins from 12/19/19 reviewed. At that time, noted to have stable shortness of breath. Echo performed due to ILD to rule out right heart failure. Electronic messages dated from 02/27/20 reviewed, noted swelling, recommended to take lasix 20 mg BID. Patient noted that her blood pressure was dropping with more than 2 doses of lasix, referred to cardiology for further evaluation.  Today: Main concern is that she gets tired easily. Had echocardiogram, told she has leaky heart valves. She has also been told that she has issues with her lungs, undergoing workup for this.   Has worn compression stockings for years due to mild leg swelling, told it was fluid/congestion. Took a fluid pill, made her sick/nauseated, did not help with the fluid.   Was very active, worked until age 30. Now she gets short of breath when she pushes herself. Had mild swelling for years before she became short of breath. Edema doesn't improving with sleeping, wakes up just as swollen. Drinks a lot of water, but feels like she doesn't urinate like she should.  Has had multiple bronchoscopies due to her sarcoidosis. Follows with Dr. Marchelle Gearing, thinks she is due for follow up next  month.  Was at one time on TID lasix, most recently told BID. She takes daily right now, only in the AM.  Denies chest pain, shortness of breath at rest. No PND, orthopnea, or unexpected weight gain. No syncope or palpitations.  We discussed her echo report. This was done through [facility], I cannot see images but I will request them for personal review.  Past Medical History:  Diagnosis Date  . Arthritis   . Cervical pain   . Hyperglycemia     Past Surgical History:  Procedure Laterality Date  . ABDOMINAL HYSTERECTOMY     Per patient  . SHOULDER SURGERY      Current Medications: Current Outpatient Medications on File Prior to Visit  Medication Sig  . ACCU-CHEK AVIVA PLUS test strip   . Accu-Chek FastClix Lancets MISC   . Estrogens Conjugated (PREMARIN PO) Take by mouth.  . EYSUVIS 0.25 % SUSP  (Patient not taking: Reported on 01/03/2020)  . furosemide (LASIX) 20 MG tablet as needed.   Marland Kitchen GLIPIZIDE PO Take by mouth. Only occasionally  . losartan (COZAAR) 25 MG tablet   . meclizine (ANTIVERT) 12.5 MG tablet Take 1 tablet (12.5 mg total) by mouth 3 (three) times daily as needed for dizziness.  . predniSONE (DELTASONE) 10 MG tablet as needed.  . Vitamin D, Ergocalciferol, (DRISDOL) 1.25 MG (50000 UNIT) CAPS capsule    No current facility-administered medications on file prior to visit.     Allergies:   Patient has no known allergies.  Social History   Tobacco Use  . Smoking status: Never Smoker  . Smokeless tobacco: Never Used  Vaping Use  . Vaping Use: Never used  Substance Use Topics  . Alcohol use: Never  . Drug use: Never    Family History: family history includes Diabetes in her father; Leukemia in her brother.  ROS:   Please see the history of present illness.  Additional pertinent ROS: Constitutional: Negative for chills, fever, night sweats, unintentional weight loss  HENT: Negative for ear pain and hearing loss.   Eyes: Negative for loss  of vision and eye pain.  Respiratory: Negative for cough, sputum, wheezing.   Cardiovascular: See HPI. Gastrointestinal: Negative for abdominal pain, melena, and hematochezia.  Genitourinary: Negative for dysuria and hematuria.  Musculoskeletal: Negative for falls and myalgias.  Skin: Negative for itching and rash.  Neurological: Negative for focal weakness, focal sensory changes and loss of consciousness.  Endo/Heme/Allergies: Does not bruise/bleed easily.     EKGs/Labs/Other Studies Reviewed:    The following studies were reviewed today: Echo 01/11/20 Report from [facility]: Normal LV systolic function with EF 61%. Left ventricle cavity is normal  in size. Mild concentric hypertrophy of the left ventricle. Normal global  wall motion.  Left atrial cavity is mildly dilated.  Trileaflet aortic valve. Moderate (Grade III) aortic regurgitation.  Moderate (Grade II), posteriorly directed mitral regurgitation.  Mild tricuspid regurgitation.  No evidence of pulmonary hypertension.Normal LV systolic function with EF 61%. Left ventricle cavity is normal  in size. Mild concentric hypertrophy of the left ventricle. Normal global  wall motion.  Left atrial cavity is mildly dilated.  Trileaflet aortic valve. Moderate (Grade III) aortic regurgitation.  Moderate (Grade II), posteriorly directed mitral regurgitation.  Mild tricuspid regurgitation.  No evidence of pulmonary hypertension.  On my review of the full report, there is no mention of diastolic parameters.   EKG:  EKG is personally reviewed.  The ekg ordered today demonstrates NSR at 87 bpm  Recent Labs: No results found for requested labs within last 8760 hours.  Recent Lipid Panel No results found for: CHOL, TRIG, HDL, CHOLHDL, VLDL, LDLCALC, LDLDIRECT  Physical Exam:    VS:  BP 123/68   Pulse 87   Ht 5' (1.524 m)   Wt 171 lb 3.2 oz (77.7 kg)   SpO2 100%   BMI 33.44 kg/m     Wt Readings from Last 3 Encounters:  03/14/20 171  lb 3.2 oz (77.7 kg)  01/03/20 162 lb 3.2 oz (73.6 kg)  11/27/19 163 lb 6.4 oz (74.1 kg)    GEN: Well nourished, well developed in no acute distress HEENT: Normal, moist mucous membranes NECK: No JVD CARDIAC: regular rhythm, normal S1 and S2, no rubs or gallops. 1/6 systolic murmur, did not appreciate diastolic murmur. VASCULAR: Radial and DP pulses 2+ bilaterally. No carotid bruits RESPIRATORY:  Largely clear with some velcro breath sounds bilateral bases  ABDOMEN: Soft, non-tender, non-distended MUSCULOSKELETAL:  Ambulates independently SKIN: Warm and dry, trivial bilateral nonpitting LE edema NEUROLOGIC:  Alert and oriented x 3. No focal neuro deficits noted. PSYCHIATRIC:  Normal affect    ASSESSMENT:    1. Bilateral leg edema   2. Dyspnea on exertion   3. ILD (interstitial lung disease) (HCC)   4. Mitral valve insufficiency, unspecified etiology   5. Aortic valve insufficiency, etiology of cardiac valve disease unspecified   6. Cardiac risk counseling    PLAN:    Bilateral LE edema Chronic dyspnea on exertion -nonpitting mild  edema today. No JVD, PND, orthopnea. Not improved by diuresis -no comment on diastolic function on echo. I will try to personally review -comments on MR/AR. We discussed that TEE would be another way to evaluate this. Murmurs not impressive today. Will try to review actual echo images. -dyspnea also under evaluation from pulmonary standpoint given sarcoidosis/ILD. Also has history of positive ANA -ok to continue lasix daily, but she reports no improvement (and feeling sick) on more frequent dosing. -discussed daily weights  Cardiac risk counseling and prevention recommendations: -recommend heart healthy/Mediterranean diet, with whole grains, fruits, vegetable, fish, lean meats, nuts, and olive oil. Limit salt. -recommend moderate walking, 3-5 times/week for 30-50 minutes each session. Aim for at least 150 minutes.week. Goal should be pace of 3  miles/hours, or walking 1.5 miles in 30 minutes -recommend avoidance of tobacco products. Avoid excess alcohol. -ASCVD risk score: The ASCVD Risk score Denman George DC Jr., et al., 2013) failed to calculate for the following reasons:   The 2013 ASCVD risk score is only valid for ages 38 to 24    Plan for follow up: 6-8 weeks. She will look for pattern of swelling/shortness of breath and track daily weights. I will attempt to get her actual echo images to personally review.  Jodelle Red, MD, PhD, [facility]  [facility]    Medication Adjustments/Labs and Tests Ordered: Current medicines are reviewed at length with the patient today.  Concerns regarding medicines are outlined above.  Orders Placed This Encounter  Procedures  . EKG 12-Lead   No orders of the defined types were placed in this encounter.   Patient Instructions   Follow-Up: At [facility], you and your health needs are our priority.  As part of our continuing mission to provide you with exceptional heart care, we have created designated Provider Care Teams.  These Care Teams include your primary Cardiologist (physician) and Advanced Practice Providers (APPs -  Physician Assistants and Nurse Practitioners) who all work together to provide you with the care you need, when you need it.  We recommend signing up for the patient portal called "MyChart".  Sign up information is provided on this After Visit Summary.  MyChart is used to connect with patients for Virtual Visits (Telemedicine).  Patients are able to view lab/test results, encounter notes, upcoming appointments, etc.  Non-urgent messages can be sent to your provider as well.   To learn more about what you can do with MyChart, go to [email or web address].    Your next appointment:   6-8 week(s)  The format for your next appointment:   In Person  Provider:   Jodelle Red, MD      Signed, Jodelle Red, MD PhD 03/14/2020      [facility]

## 2020-03-17 ENCOUNTER — Encounter: Payer: Self-pay | Admitting: Cardiology

## 2020-03-17 DIAGNOSIS — I340 Nonrheumatic mitral (valve) insufficiency: Secondary | ICD-10-CM | POA: Insufficient documentation

## 2020-03-17 DIAGNOSIS — R0609 Other forms of dyspnea: Secondary | ICD-10-CM | POA: Insufficient documentation

## 2020-03-17 DIAGNOSIS — R0600 Dyspnea, unspecified: Secondary | ICD-10-CM | POA: Insufficient documentation

## 2020-03-17 DIAGNOSIS — R600 Localized edema: Secondary | ICD-10-CM | POA: Insufficient documentation

## 2020-03-17 DIAGNOSIS — I351 Nonrheumatic aortic (valve) insufficiency: Secondary | ICD-10-CM | POA: Insufficient documentation

## 2020-04-24 ENCOUNTER — Ambulatory Visit (INDEPENDENT_AMBULATORY_CARE_PROVIDER_SITE_OTHER): Payer: Medicare PPO | Admitting: Cardiology

## 2020-04-24 ENCOUNTER — Encounter: Payer: Self-pay | Admitting: Cardiology

## 2020-04-24 ENCOUNTER — Other Ambulatory Visit: Payer: Self-pay

## 2020-04-24 VITALS — BP 118/62 | HR 76 | Ht 60.0 in | Wt 168.0 lb

## 2020-04-24 DIAGNOSIS — R0600 Dyspnea, unspecified: Secondary | ICD-10-CM | POA: Diagnosis not present

## 2020-04-24 DIAGNOSIS — I340 Nonrheumatic mitral (valve) insufficiency: Secondary | ICD-10-CM

## 2020-04-24 DIAGNOSIS — R600 Localized edema: Secondary | ICD-10-CM

## 2020-04-24 DIAGNOSIS — I351 Nonrheumatic aortic (valve) insufficiency: Secondary | ICD-10-CM | POA: Diagnosis not present

## 2020-04-24 DIAGNOSIS — R0609 Other forms of dyspnea: Secondary | ICD-10-CM

## 2020-04-24 NOTE — Progress Notes (Signed)
Cardiology Office Note:    Date:  04/24/2020   ID:  Adriana Donovan, DOB 1940-07-31, MRN [id number]  PCP:  Michael Boston, MD  Cardiologist:  Buford Dresser, MD  Referring MD: Michael Boston, MD   CC: follow up  History of Present Illness:    Adriana Donovan is a 80 y.o. female with a hx of sarcoidosis, type II diabetes, hypertension, interstitial lung disease who is seen for follow up today. I initially met her 03/14/20 as a new consult at the request of Jacalyn Lefevre Jesse Sans, MD for the evaluation and management of bilateral LE edema.  Referral from 02/28/20 notes bilateral LE edema, chronic kidney disease state 2, type II diabetes. She had an echo done by [facility] 01/12/20 as noted below, normal EF, moderate AR, moderate MR.  Today: I have not been able to view actual images of her echo, as these were done by [facility]. She reports that she is doing stable to improved. Only minimal LE edema. Does not feel that her breathing is significantly worse and may be slightly better.  We reviewed her echo, concern for AR/MR being at least moderate on those images. Discussed options, reviewed below.  Denies chest pain, shortness of breath at rest. No PND, orthopnea, worsening LE edema or unexpected weight gain. No syncope or palpitations.  Past Medical History:  Diagnosis Date  . Arthritis   . Cervical pain   . Hyperglycemia     Past Surgical History:  Procedure Laterality Date  . ABDOMINAL HYSTERECTOMY     Per patient  . SHOULDER SURGERY      Current Medications: Current Outpatient Medications on File Prior to Visit  Medication Sig  . ACCU-CHEK AVIVA PLUS test strip   . Accu-Chek FastClix Lancets MISC   . Estrogens Conjugated (PREMARIN PO) Take by mouth.  . EYSUVIS 0.25 % SUSP   . furosemide (LASIX) 20 MG tablet as needed.   Marland Kitchen GLIPIZIDE PO Take by mouth. Only occasionally  . losartan (COZAAR) 25 MG tablet   . meclizine (ANTIVERT) 12.5 MG tablet Take  1 tablet (12.5 mg total) by mouth 3 (three) times daily as needed for dizziness.  . predniSONE (DELTASONE) 10 MG tablet as needed.  . Vitamin D, Ergocalciferol, (DRISDOL) 1.25 MG (50000 UNIT) CAPS capsule    No current facility-administered medications on file prior to visit.     Allergies:   Patient has no known allergies.   Social History   Tobacco Use  . Smoking status: Never Smoker  . Smokeless tobacco: Never Used  Vaping Use  . Vaping Use: Never used  Substance Use Topics  . Alcohol use: Never  . Drug use: Never    Family History: family history includes Diabetes in her father; Leukemia in her brother.  ROS:   Please see the history of present illness.  Additional pertinent ROS otherwise unremarkable.  EKGs/Labs/Other Studies Reviewed:    The following studies were reviewed today: Echo 01/11/20 Report from [facility]: Normal LV systolic function with EF 61%. Left ventricle cavity is normal  in size. Mild concentric hypertrophy of the left ventricle. Normal global  wall motion.  Left atrial cavity is mildly dilated.  Trileaflet aortic valve. Moderate (Grade III) aortic regurgitation.  Moderate (Grade II), posteriorly directed mitral regurgitation.  Mild tricuspid regurgitation.  No evidence of pulmonary hypertension.Normal LV systolic function with EF 61%. Left ventricle cavity is normal  in size. Mild concentric hypertrophy of the left ventricle. Normal global  wall motion.  Left  atrial cavity is mildly dilated.  Trileaflet aortic valve. Moderate (Grade III) aortic regurgitation.  Moderate (Grade II), posteriorly directed mitral regurgitation.  Mild tricuspid regurgitation.  No evidence of pulmonary hypertension.  On my review of the full report, there is no mention of diastolic parameters.   EKG:  EKG is personally reviewed.  The ekg ordered 03/14/20 demonstrates NSR at 87 bpm  Recent Labs: No results found for requested labs within last 8760 hours.  Recent Lipid  Panel No results found for: CHOL, TRIG, HDL, CHOLHDL, VLDL, LDLCALC, LDLDIRECT  Physical Exam:    VS:  BP 118/62 (BP Location: Left Arm, Patient Position: Sitting, Cuff Size: Normal)   Pulse 76   Ht 5' (1.524 m)   Wt 168 lb (76.2 kg)   BMI 32.81 kg/m     Wt Readings from Last 3 Encounters:  04/24/20 168 lb (76.2 kg)  03/14/20 171 lb 3.2 oz (77.7 kg)  01/03/20 162 lb 3.2 oz (73.6 kg)   GEN: Well nourished, well developed in no acute distress HEENT: Normal, moist mucous membranes NECK: No JVD CARDIAC: regular rhythm, normal S1 and S2, no rubs or gallops. 1/6 systolic murmur, did not appreciate diastolic murmur. VASCULAR: Radial and DP pulses 2+ bilaterally. No carotid bruits RESPIRATORY:  Largely clear, mildly diminished at bilateral bases ABDOMEN: Soft, non-tender, non-distended MUSCULOSKELETAL:  Ambulates independently SKIN: Warm and dry, trivial bilateral LE edema NEUROLOGIC:  Alert and oriented x 3. No focal neuro deficits noted. PSYCHIATRIC:  Normal affect   ASSESSMENT:    1. Bilateral leg edema   2. Dyspnea on exertion   3. Aortic valve insufficiency, etiology of cardiac valve disease unspecified   4. Mitral valve insufficiency, unspecified etiology    PLAN:    Bilateral LE edema Chronic dyspnea on exertion Mitral valve insufficiency Aortic valve insufficiency -trivial edema today. No JVD, PND, orthopnea. Not improved by diuresis -I have not been able to assess her actual echo images for diastolic dysfunction. As she is stable and edema well controlled, would not change management at this time -we discussed evaluation options for MR/AR. After shared decision making, will follow clinically at this time, repeat echo for worsening symptoms or on routine monitoring scheduled -weights stable, recommended lasix as needed for swelling or weight gaian -dyspnea also under evaluation from pulmonary standpoint given sarcoidosis/ILD. Also has history of positive ANA  Cardiac  risk counseling and prevention recommendations: -recommend heart healthy/Mediterranean diet, with whole grains, fruits, vegetable, fish, lean meats, nuts, and olive oil. Limit salt. -recommend moderate walking, 3-5 times/week for 30-50 minutes each session. Aim for at least 150 minutes.week. Goal should be pace of 3 miles/hours, or walking 1.5 miles in 30 minutes -recommend avoidance of tobacco products. Avoid excess alcohol. -ASCVD risk score: The ASCVD Risk score Mikey Bussing DC Jr., et al., 2013) failed to calculate for the following reasons:   The 2013 ASCVD risk score is only valid for ages 61 to 2    Plan for follow up: 3 mos  Buford Dresser, MD, PhD, [facility]    Medication Adjustments/Labs and Tests Ordered: Current medicines are reviewed at length with the patient today.  Concerns regarding medicines are outlined above.  No orders of the defined types were placed in this encounter.  No orders of the defined types were placed in this encounter.   Patient Instructions  Medication Instructions:  Your Physician recommend you continue on your current medication as directed.    *If you need a refill  on your cardiac medications before your next appointment, please call your pharmacy*   Lab Work: None   Testing/Procedures: None   Follow-Up: At [facility], you and your health needs are our priority.  As part of our continuing mission to provide you with exceptional heart care, we have created designated Provider Care Teams.  These Care Teams include your primary Cardiologist (physician) and Advanced Practice Providers (APPs -  Physician Assistants and Nurse Practitioners) who all work together to provide you with the care you need, when you need it.  We recommend signing up for the patient portal called "MyChart".  Sign up information is provided on this After Visit Summary.  MyChart is used to connect with patients for Virtual Visits (Telemedicine).   Patients are able to view lab/test results, encounter notes, upcoming appointments, etc.  Non-urgent messages can be sent to your provider as well.   To learn more about what you can do with MyChart, go to [email or web address].    Your next appointment:   3 month(s)  The format for your next appointment:   In Person  Provider:   Buford Dresser, MD     Signed, Buford Dresser, MD PhD 04/24/2020     [place]

## 2020-04-24 NOTE — Patient Instructions (Signed)

## 2020-04-28 ENCOUNTER — Encounter: Payer: Self-pay | Admitting: Cardiology

## 2020-06-20 ENCOUNTER — Encounter: Payer: Self-pay | Admitting: Internal Medicine

## 2020-06-20 ENCOUNTER — Ambulatory Visit (INDEPENDENT_AMBULATORY_CARE_PROVIDER_SITE_OTHER): Payer: Medicare PPO | Admitting: Internal Medicine

## 2020-06-20 ENCOUNTER — Other Ambulatory Visit: Payer: Self-pay

## 2020-06-20 VITALS — BP 136/84 | HR 77 | Temp 97.7°F | Ht 62.0 in | Wt 162.8 lb

## 2020-06-20 DIAGNOSIS — J849 Interstitial pulmonary disease, unspecified: Secondary | ICD-10-CM

## 2020-06-20 DIAGNOSIS — M5440 Lumbago with sciatica, unspecified side: Secondary | ICD-10-CM | POA: Diagnosis not present

## 2020-06-20 NOTE — Progress Notes (Signed)
OV 10/18/2019  Subjective:  [name], female , DOB: 06/13/1940 , age 80 y.o. , MRN: [id number] , [address]: [address] [place] [id number]   10/18/2019 -   Chief Complaint  Patient presents with  . Consult    reports feeling at baseline today. reports cough stopped after completing recent course of abx.     [name] 80 y.o. - [place].    Then around 22 [place] ago [place] [place] [facility] and [place].  She is here because of an abnormal CT chest.  She states some 30 years ago while in [place] she had a cough and then a chest x-ray showed something.  She was then admitted to the hospital had a bronchoscopy and apparently and the biopsy showed sarcoidosis.  She took steroids for 6 months and then after that the pulmonologist told her she was "done".  Then she was doing fine without any respiratory issues.  Then approximately 15 years ago went [place] see [place] mom and shortly after that just before her return she got sick with respiratory issues.  When she arrived in the room she was admitted to the hospital for 7 days.  She does not know any details.  She says the doctor never give any details.  She does not remember being on oxygen.  She was discharged on steroids which she took for "short while".  She was told that the steroids clouded her diagnosis.  No specific etiology was found but then she got better after the steroids course was done.  And she been doing fine ever since.  Then approximately 6-week ago she had some benefit surgery to her left side.  This was around the hip to remove some scar.  Some week or 2 after that for the last 4 to 6 weeks she has had cough and shortness of breath.  Shortness of breath is present on exertion.  There is no wheezing or proximal nocturnal dyspnea or orthopnea.  She is now on prednisone for the last 10 days.  With this the cough is resolved but the still  shortness of breath on exertion relieved by rest.  She has no past history of pulmonary embolism or cancer connective tissue disease.  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  In my personal visualization this was also present in the lung images of an abdominal CT from September 2020.  There are some elevation of the right hemidiaphragm.  She is noted to be on losartan.  Of note she is quite active and even does some gardening work.   SYMPTOM SCALE - ILD 10/18/2019   O2 use ra  Shortness of Breath 0 -> 5 scale with 5 being worst (score 6 If unable to do)  At rest 0  Simple tasks - showers, clothes change, eating, shaving 1  Household (dishes, doing bed, laundry) 1  Shopping 1  Walking level at own pace 2  Walking up Stairs *3  Total (30-36) Dyspnea Score 8  How bad is your cough? 0  How bad is your fatigue 2  How bad is nausea 0  How bad is vomiting?  0  How bad is diarrhea? 0  How bad is anxiety? 0  How bad is depression 0       Simple office walk 185 feet x  3 laps goal with forehead probe 10/18/2019  O2 used ra  Number laps completed 3  Comments about pace moder pace - briks  Resting Pulse Ox/HR 96% and 89/min  Final Pulse Ox/HR 96% and 98/min  Desaturated </= 88% no  Desaturated <= 3% points no  Got Tachycardic >/= 90/min yes  Symptoms at end of test No dyspnea, just tired  Miscellaneous comments x     11/27/2019  - Visit   80 year old female never smoker followed in our office for interstitial lung disease. She was last seen for initial consult in August/2021 by Dr. Marchelle Gearing. Plan of care from that office visit was as follows:  Fill out ILD questionnaire, stop prednisone today, do full pulmonary function testing, obtain autoimmune panel, follow-up in 4 to 8 weeks with Dr. Marchelle Gearing.  Patient's connective tissue lab work was essentially negative. Mildly elevated ANA. Titer of 1: 80, reviewed by Dr. Marchelle Gearing  and felt to be of no clinical significance. Patient presenting today after completing pulmonary function testing. Those results are listed below:  11/27/2019-pulmonary function test-FVC 1.95 (86% predicted), postbronchodilator ratio 88, postbronchodilator FEV1 1.83 (109% predicted), no bronchodilator response, TLC 3.82 (83% predicted), DLCO 12.41 (73% predicted), lower level of normal DLCO 14.15  Patient continues to report left abdominal side pain as well as left lower extremity leg pain numbness and tingling.  She plans to follow-up with primary care tomorrow.  She reports the numbness and tingling is worse at night.  She does have type 2 diabetes.  She reports that her blood sugars have been controlled but they have been persistently elevated in the past due to recurrent prednisone use.  She was recently prescribed Lasix by primary care but she did not notice much improvement.    She has an appointment with primary care tomorrow Dr. Genevie Ann to discuss this.  Patient is up-to-date with COVID-19 vaccinations as well as flu vaccine.  Patient reports that she is filled out the ILD questionnaire, and she dropped this back off at our office.  Walked in office today was able to complete 2 laps with oxygen levels dropping to 88% on room air, patient did not require oxygen  Tests:   10/18/2019-connective tissue labs-positive ANA, ANA titer 1: 80, nuclear, speckled Rest of connective tissue labs negative  09/25/2019-CT chest without contrast-mild bibasilar predominant irregular peripheral interstitial and groundglass opacity generally nonspecific and not characteristics and appearance of fibrotic pulmonary sarcoidosis, indeterminate for UIP pattern, consider follow-up ILD protocol CT chest in 1 year to observe stability of fibrotic findings, occasional scattered calcified and benign small pulmonary nodules, elevation of right hemidiaphragm, 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 , MRN: [id number] , ADDRESS: [address] [place] [address] PCP Melida Quitter, MD Patient Care Team: Melida Quitter, MD as PCP - General (Internal Medicine) Jodelle Red, MD as PCP - Cardiology (Cardiology)  This Provider for this visit: Treatment Team:  Attending Provider: Kalman Shan, MD    06/20/2020 -   Chief Complaint  Patient presents with  . Follow-up    Reports shortness of breath with activity. Also concerned about back pain, recently diagnosed with scoliosis.  Fu ILD NOS  [name] 80 y.o. -returns for followup. Since I last saw her has seen PCP Nadene Rubins, Nyoka Cowden, MD and APP in our office. Her breathing is stable. No cough. However, last 6 weeks has had back pain with some sciatica . Persistent. Severe pain esp with movement and twisting. Says has had MRI and shows lower back scolipsis. Has failed steroid shorts. Going to get 2nd opinion. Pain making dyspnea worse buyt overall feels ILD stable  Could not walk for walk test At home has difficulty wit ADL and changing clothes dye to back pain       SYMPTOM SCALE - ILD 10/18/2019  06/20/2020   O2 use ra   Shortness of Breath 0 -> 5 scale with 5 being worst (score 6 If unable to do)   At rest 0   Simple tasks - showers, clothes change, eating, shaving 1   Household (dishes, doing bed, laundry) 1   Shopping 1   Walking level at own pace 2   Walking up Stairs *3   Total (30-36) Dyspnea Score 8   How bad is your cough? 0   How bad is your fatigue 2   How bad is nausea 0   How bad is  vomiting?  0   How bad is diarrhea? 0   How bad is anxiety? 0   How bad is depression 0        Simple office walk 185 feet x  3 laps goal with forehead probe 10/18/2019  06/20/2020   O2 used ra ra  Number laps completed 3 Attempted 3 byt did 1 only  Comments about pace moder pace - briks   Resting Pulse Ox/HR 96% and 89/min 97% and 78  Final Pulse Ox/HR 96% and 98/min 97% and 90  Desaturated </= 88% no no  Desaturated <= 3% points no no  Got Tachycardic >/= 90/min yes yes  Symptoms at end of test No dyspnea, just tired Stopped due to severe back pain and nausea  Miscellaneous comments x Scoliosis issue      PFT  PFT Results Latest Ref Rng & Units 11/27/2019  FVC-Pre L 1.95  FVC-Predicted Pre % 86  FVC-Post L 2.08  FVC-Predicted Post % 92  Pre FEV1/FVC % % 84  Post FEV1/FCV % % 88  FEV1-Pre L 1.63  FEV1-Predicted Pre % 98  FEV1-Post L 1.83  DLCO uncorrected ml/min/mmHg 12.41  DLCO UNC% % 73  DLCO corrected ml/min/mmHg 12.41  DLCO COR %Predicted % 73  DLVA Predicted % 77  TLC L 3.82  TLC % Predicted % 83  RV % Predicted % 82    ECHO Nov 2021  Echocardiogram 01/11/2020:  Normal LV systolic function with EF 61%. Left ventricle cavity is normal  in size. Mild concentric hypertrophy of the left ventricle. Normal global  wall motion.  Left atrial cavity is mildly dilated.  Trileaflet aortic valve. Moderate (Grade III) aortic regurgitation.  Moderate (Grade II), posteriorly directed mitral regurgitation.  Mild tricuspid regurgitation.  No evidence of pulmonary hypertension   has a past medical history of Arthritis, Cervical pain, and Hyperglycemia.   reports that she has never smoked. She has never used smokeless tobacco.  Past Surgical History:  Procedure Laterality Date  . ABDOMINAL HYSTERECTOMY     Per patient  . SHOULDER SURGERY      No Known Allergies  Immunization History  Administered Date(s) Administered  . Fluad Quad(high Dose 65+) 11/13/2019   . Influenza,  Quadrivalent, Recombinant, Inj, Pf 11/10/2019  . Janssen (J&J) SARS-COV-2 Vaccination 05/12/2019  . Moderna Sars-Covid-2 Vaccination 05/24/2019, 12/24/2019  . Pneumococcal Conjugate-13 11/13/2019  . Pneumococcal-Unspecified 11/10/2019    Family History  Problem Relation Age of Onset  . Diabetes Father   . Leukemia Brother      Current Outpatient Medications:  .  ACCU-CHEK AVIVA PLUS test strip, , Disp: , Rfl:  .  Accu-Chek FastClix Lancets MISC, , Disp: , Rfl:  .  Estrogens Conjugated (PREMARIN PO), Take by mouth., Disp: , Rfl:  .  EYSUVIS 0.25 % SUSP, , Disp: , Rfl:  .  furosemide (LASIX) 20 MG tablet, as needed. , Disp: , Rfl:  .  GLIPIZIDE PO, Take by mouth. Only occasionally, Disp: , Rfl:  .  losartan (COZAAR) 25 MG tablet, , Disp: , Rfl:  .  Vitamin D, Ergocalciferol, (DRISDOL) 1.25 MG (50000 UNIT) CAPS capsule, , Disp: , Rfl:  .  meclizine (ANTIVERT) 12.5 MG tablet, Take 1 tablet (12.5 mg total) by mouth 3 (three) times daily as needed for dizziness. (Patient not taking: Reported on 06/20/2020), Disp: 30 tablet, Rfl: 0      Objective:   Vitals:   06/20/20 1411  BP: 136/84  Pulse: 77  Temp: 97.7 F (36.5 C)  TempSrc: Temporal  SpO2: 100%  Weight: 162 lb 12.8 oz (73.8 kg)  Height: 5\' 2"  (1.575 m)    Estimated body mass index is 29.78 kg/m as calculated from the following:   Height as of this encounter: 5\' 2"  (1.575 m).   Weight as of this encounter: 162 lb 12.8 oz (73.8 kg).  @WEIGHTCHANGE @    06/20/20 1411  Weight: 162 lb 12.8 oz (73.8 kg)     Physical Exam  General: No distress. Looks well Neuro: Alert and Oriented x 3. GCS 15. Speech normal Psych: Pleasant Resp:  Barrel Chest - no.  Wheeze - no, Crackles - yes mild at bilateral lower lungs, , No overt respiratory distress CVS: Normal heart sounds. Murmurs - no Ext: Stigmata of Connective Tissue Disease - no HEENT: Normal upper airway. PEERL +. No post nasal drip  Has  "knot" right lower back        Assessment:       ICD-10-CM   1. ILD (interstitial lung disease) (HCC)  J84.9   2. Acute bilateral low back pain with sciatica, sciatica laterality unspecified  M54.40        Plan:     Patient Instructions  ILD (interstitial lung disease) (HCC)  -Clinically undifferentiated, mild and stable  Plan - Do high-resolution CT chest in 3-4 months [1 year follow-up] - Do spirometry and DLCO in 3-4 months   Acute bilateral low back pain with sciatica, sciatica laterality unspecified - new  -Present for the last 6 weeks - On my exam I can palpate myofascial spasm in the right lower back paraspinal area  Plan - Per primary care physician and orthopedic physician -Asked them about nonoperative intervention such as chiropractor, dry needling, massage and physical therapy  Follow-up - Return in 3 to 4 months but after CT scan of chest and breathing test     SIGNATURE    Dr. , M.D., F.C.C.P,  Pulmonary and Critical Care Medicine Staff Physician, [facility] Director - Interstitial Lung Disease  Program  Pulmonary Fibrosis [facility] at [facility], [facility], [facility]  Pager: [phone], If no answer or between  15:00h - 7:00h:  call [phone] Telephone: [phone]  2:45 PM 06/20/2020

## 2020-06-20 NOTE — Patient Instructions (Signed)
ILD (interstitial lung disease) (HCC)  -Clinically undifferentiated, mild and stable  Plan - Do high-resolution CT chest in 3-4 months [1 year follow-up] - Do spirometry and DLCO in 3-4 months   Acute bilateral low back pain with sciatica, sciatica laterality unspecified - new  -Present for the last 6 weeks - On my exam I can palpate myofascial spasm in the right lower back paraspinal area  Plan - Per primary care physician and orthopedic physician -Asked them about nonoperative intervention such as chiropractor, dry needling, massage and physical therapy  Follow-up - Return in 3 to 4 months but after CT scan of chest and breathing test

## 2020-07-30 ENCOUNTER — Ambulatory Visit (INDEPENDENT_AMBULATORY_CARE_PROVIDER_SITE_OTHER): Payer: Medicare PPO | Admitting: Cardiology

## 2020-07-30 ENCOUNTER — Encounter: Payer: Self-pay | Admitting: Cardiology

## 2020-07-30 ENCOUNTER — Other Ambulatory Visit: Payer: Self-pay

## 2020-07-30 VITALS — BP 100/64 | HR 84 | Ht 62.0 in | Wt 162.0 lb

## 2020-07-30 DIAGNOSIS — I340 Nonrheumatic mitral (valve) insufficiency: Secondary | ICD-10-CM

## 2020-07-30 DIAGNOSIS — R600 Localized edema: Secondary | ICD-10-CM

## 2020-07-30 DIAGNOSIS — R0609 Other forms of dyspnea: Secondary | ICD-10-CM

## 2020-07-30 DIAGNOSIS — I351 Nonrheumatic aortic (valve) insufficiency: Secondary | ICD-10-CM

## 2020-07-30 DIAGNOSIS — R0600 Dyspnea, unspecified: Secondary | ICD-10-CM | POA: Diagnosis not present

## 2020-07-30 NOTE — Patient Instructions (Signed)
Medication Instructions:  Your Physician recommend you continue on your current medication as directed.    *If you need a refill on your cardiac medications before your next appointment, please call your pharmacy*   Lab Work: None ordered today   Testing/Procedures: None ordered today   Follow-Up: At [facility], you and your health needs are our priority.  As part of our continuing mission to provide you with exceptional heart care, we have created designated Provider Care Teams.  These Care Teams include your primary Cardiologist (physician) and Advanced Practice Providers (APPs -  Physician Assistants and Nurse Practitioners) who all work together to provide you with the care you need, when you need it.  We recommend signing up for the patient portal called "MyChart".  Sign up information is provided on this After Visit Summary.  MyChart is used to connect with patients for Virtual Visits (Telemedicine).  Patients are able to view lab/test results, encounter notes, upcoming appointments, etc.  Non-urgent messages can be sent to your provider as well.   To learn more about what you can do with MyChart, go to [email or web address].    Your next appointment:   6 month(s) @ [address] [place], [place] [address]   The format for your next appointment:   In Person  Provider:   Bridgette Christopher, MD     

## 2020-07-30 NOTE — Progress Notes (Signed)
Cardiology Office Note:    Date:  07/30/2020   ID:  Adriana Donovan, DOB 1940-08-20, MRN [id number]  PCP:  Michael Boston, MD  Cardiologist:  Buford Dresser, MD  Referring MD: Michael Boston, MD   CC: follow up  History of Present Illness:    Adriana Donovan is a 80 y.o. female with a hx of sarcoidosis, type II diabetes, hypertension, interstitial lung disease who is seen for follow up today. I initially met her 03/14/20 as a new consult at the request of Jacalyn Lefevre Jesse Sans, MD for the evaluation and management of bilateral LE edema.  Referral from 02/28/20 notes bilateral LE edema, chronic kidney disease state 2, type II diabetes. She had an echo done by [facility] 01/12/20 as noted below, normal EF, moderate AR, moderate MR.  Today: She is feeling okay overall. She still has shortness of breath after exertion, as well as some LE edema. She remains compliant with her fluid medication and takes 1 Lasix tablet daily. She notes that at this visit her LE edema is less than baseline.  She denies any chest pain, palpitations, headaches, lightheadedness, or syncope. Also has no orthopnea or PND.  She is scheduled for a steroid injection on June 6th due to back pain.  She brings a CD of her echo with her today.   Past Medical History:  Diagnosis Date  . Arthritis   . Cervical pain   . Hyperglycemia     Past Surgical History:  Procedure Laterality Date  . ABDOMINAL HYSTERECTOMY     Per patient  . SHOULDER SURGERY      Current Medications: Current Outpatient Medications on File Prior to Visit  Medication Sig  . ACCU-CHEK AVIVA PLUS test strip   . Accu-Chek FastClix Lancets MISC   . Estrogens Conjugated (PREMARIN PO) Take by mouth.  . EYSUVIS 0.25 % SUSP   . furosemide (LASIX) 20 MG tablet as needed.   Marland Kitchen GLIPIZIDE PO Take by mouth. Only occasionally  . losartan (COZAAR) 25 MG tablet   . meclizine (ANTIVERT) 12.5 MG tablet Take 1 tablet (12.5 mg total) by mouth 3  (three) times daily as needed for dizziness. (Patient not taking: Reported on 06/20/2020)  . Vitamin D, Ergocalciferol, (DRISDOL) 1.25 MG (50000 UNIT) CAPS capsule    No current facility-administered medications on file prior to visit.     Allergies:   Patient has no known allergies.   Social History   Tobacco Use  . Smoking status: Never Smoker  . Smokeless tobacco: Never Used  Vaping Use  . Vaping Use: Never used  Substance Use Topics  . Alcohol use: Never  . Drug use: Never    Family History: family history includes Diabetes in her father; Leukemia in her brother.  ROS:   Please see the history of present illness.   (+) bilateral LE edema (+) Shortness of breath (+) Back pain Additional pertinent ROS otherwise unremarkable.  EKGs/Labs/Other Studies Reviewed:    The following studies were reviewed today: Echo 01/11/20 Report from [facility]: Normal LV systolic function with EF 61%. Left ventricle cavity is normal  in size. Mild concentric hypertrophy of the left ventricle. Normal global  wall motion.  Left atrial cavity is mildly dilated.  Trileaflet aortic valve. Moderate (Grade III) aortic regurgitation.  Moderate (Grade II), posteriorly directed mitral regurgitation.  Mild tricuspid regurgitation.  No evidence of pulmonary hypertension.Normal LV systolic function with EF 61%. Left ventricle cavity is normal  in size. Mild concentric hypertrophy of  the left ventricle. Normal global  wall motion.  Left atrial cavity is mildly dilated.  Trileaflet aortic valve. Moderate (Grade III) aortic regurgitation.  Moderate (Grade II), posteriorly directed mitral regurgitation.  Mild tricuspid regurgitation.  No evidence of pulmonary hypertension.  On my review of the full report, there is no mention of diastolic parameters.   EKG:  EKG is personally reviewed.   07/30/2020: EKG is not ordered today. 03/14/20: NSR at 87 bpm  Recent Labs: No results found for requested labs  within last 8760 hours.  Recent Lipid Panel No results found for: CHOL, TRIG, HDL, CHOLHDL, VLDL, LDLCALC, LDLDIRECT  Physical Exam:    VS:  BP 100/64 (BP Location: Left Arm, Patient Position: Sitting, Cuff Size: Normal)   Pulse 84   Ht '5\' 2"'  (1.575 m)   Wt 162 lb (73.5 kg)   BMI 29.63 kg/m     Wt Readings from Last 3 Encounters:  07/30/20 162 lb (73.5 kg)  06/20/20 162 lb 12.8 oz (73.8 kg)  04/24/20 168 lb (76.2 kg)   GEN: Well nourished, well developed in no acute distress HEENT: Normal, moist mucous membranes NECK: No JVD CARDIAC: regular rhythm, normal S1 and S2, no rubs or gallops. 1/6 systolic murmur, did not appreciate diastolic murmur VASCULAR: Radial and DP pulses 2+ bilaterally. No carotid bruits RESPIRATORY:  Clear to auscultation bilaterally ABDOMEN: Soft, non-tender, non-distended MUSCULOSKELETAL:  Ambulates independently SKIN: Warm and dry, trivial bilateral LE edema NEUROLOGIC:  Alert and oriented x 3. No focal neuro deficits noted. PSYCHIATRIC:  Normal affect   ASSESSMENT:    1. Dyspnea on exertion   2. Bilateral leg edema   3. Aortic valve insufficiency, etiology of cardiac valve disease unspecified   4. Mitral valve insufficiency, unspecified etiology    PLAN:    Bilateral LE edema Chronic dyspnea on exertion Mitral valve insufficiency Aortic valve insufficiency -trivial edema today. No JVD, PND, orthopnea. Not improved by diuresis -she brings a CD of her echo today. I will review. -we discussed evaluation options for MR/AR. After shared decision making, will follow clinically at this time, repeat echo for worsening symptoms or on routine monitoring scheduled -weights stable, recommended lasix as needed for swelling or weight gaian -dyspnea also under evaluation from pulmonary standpoint given sarcoidosis/ILD. Also has history of positive ANA  Cardiac risk counseling and prevention recommendations: -recommend heart healthy/Mediterranean diet, with  whole grains, fruits, vegetable, fish, lean meats, nuts, and olive oil. Limit salt. -recommend moderate walking, 3-5 times/week for 30-50 minutes each session. Aim for at least 150 minutes.week. Goal should be pace of 3 miles/hours, or walking 1.5 miles in 30 minutes -recommend avoidance of tobacco products. Avoid excess alcohol. -ASCVD risk score: The ASCVD Risk score Mikey Bussing DC Jr., et al., 2013) failed to calculate for the following reasons:   The 2013 ASCVD risk score is only valid for ages 74 to 61    Plan for follow up: 6 mos or sooner PRN  Buford Dresser, MD, PhD, [facility]    Medication Adjustments/Labs and Tests Ordered: Current medicines are reviewed at length with the patient today.  Concerns regarding medicines are outlined above.  No orders of the defined types were placed in this encounter.  No orders of the defined types were placed in this encounter.   Patient Instructions  Medication Instructions:  Your Physician recommend you continue on your current medication as directed.    *If you need a refill on your cardiac medications before your  next appointment, please call your pharmacy*   Lab Work: None ordered today   Testing/Procedures: None ordered today   Follow-Up: At [facility], you and your health needs are our priority.  As part of our continuing mission to provide you with exceptional heart care, we have created designated Provider Care Teams.  These Care Teams include your primary Cardiologist (physician) and Advanced Practice Providers (APPs -  Physician Assistants and Nurse Practitioners) who all work together to provide you with the care you need, when you need it.  We recommend signing up for the patient portal called "MyChart".  Sign up information is provided on this After Visit Summary.  MyChart is used to connect with patients for Virtual Visits (Telemedicine).  Patients are able to view lab/test results, encounter  notes, upcoming appointments, etc.  Non-urgent messages can be sent to your provider as well.   To learn more about what you can do with MyChart, go to [email or web address].    Your next appointment:   6 month(s) @ [address] [place], [place] [address]   The format for your next appointment:   In Person  Provider:   Buford Dresser, MD       [facility],acting as a scribe for Buford Dresser, MD.,have documented all relevant documentation on the behalf of Buford Dresser, MD,as directed by  Buford Dresser, MD while in the presence of Buford Dresser, MD.  I, Buford Dresser, MD, have reviewed all documentation for this visit. The documentation on 08/06/20 for the exam, diagnosis, procedures, and orders are all accurate and complete.  Signed, Buford Dresser, MD PhD 07/30/2020     [facility]

## 2020-08-06 ENCOUNTER — Encounter: Payer: Self-pay | Admitting: Cardiology

## 2020-08-15 ENCOUNTER — Other Ambulatory Visit: Payer: Self-pay | Admitting: Internal Medicine

## 2020-09-25 ENCOUNTER — Ambulatory Visit
Admission: RE | Admit: 2020-09-25 | Discharge: 2020-09-25 | Disposition: A | Discharge status: Home / Self Care | Payer: Medicare PPO | Source: Ambulatory Visit | Attending: Internal Medicine | Admitting: Internal Medicine

## 2020-09-25 ENCOUNTER — Other Ambulatory Visit: Payer: Self-pay

## 2020-09-25 DIAGNOSIS — J849 Interstitial pulmonary disease, unspecified: Secondary | ICD-10-CM

## 2020-09-25 IMAGING — CT CT CHEST HIGH RESOLUTION W/O CM
1 of 4 series · 14 of 30 positions shown, 18 images · non-contrast
Comparison: [DATE] chest CT.

CLINICAL DATA: Sarcoidosis. Follow-up possible interstitial lung
disease.

EXAM:
CT CHEST WITHOUT CONTRAST
TECHNIQUE: Multidetector CT imaging of the chest was performed following the
standard protocol without intravenous contrast. High resolution
imaging of the lungs, as well as inspiratory and expiratory imaging,
was performed.

[Series 10: hi res 1mm · axial · 0.74mm/px · z∈[-209,+21]mm · 14 of 264 slices shown, 18 images]
[im 17/264  mediastinal]
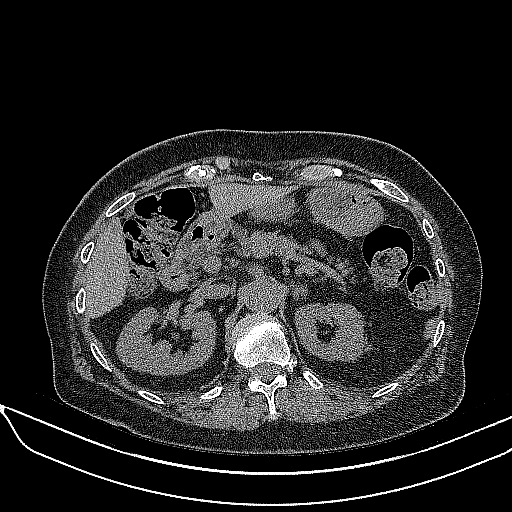
[im 17/264  lung]
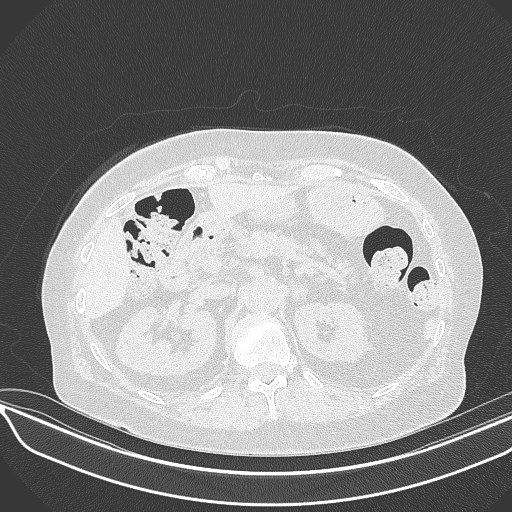
[im 33/264  lung]
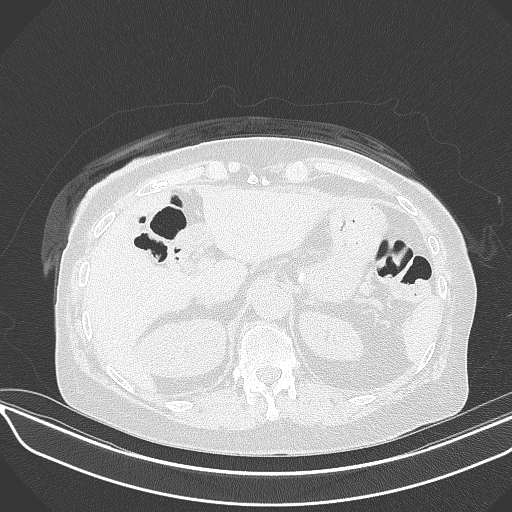
[im 50/264  lung]
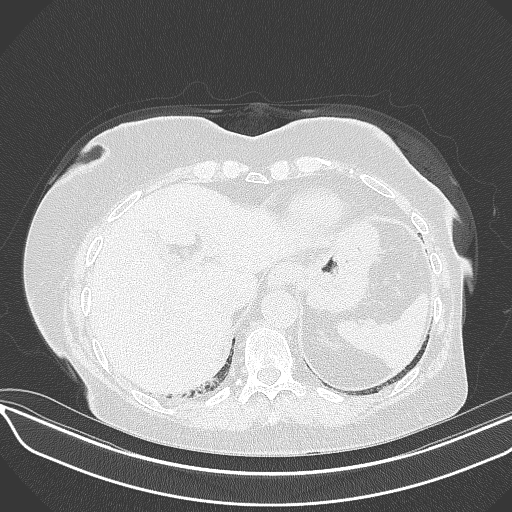
[im 83/264  lung]
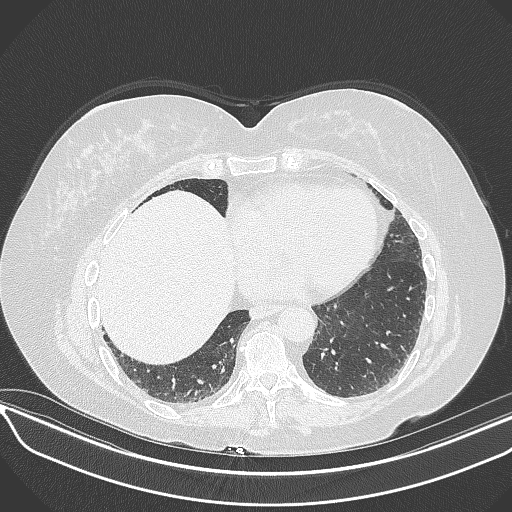
[im 99/264  mediastinal]
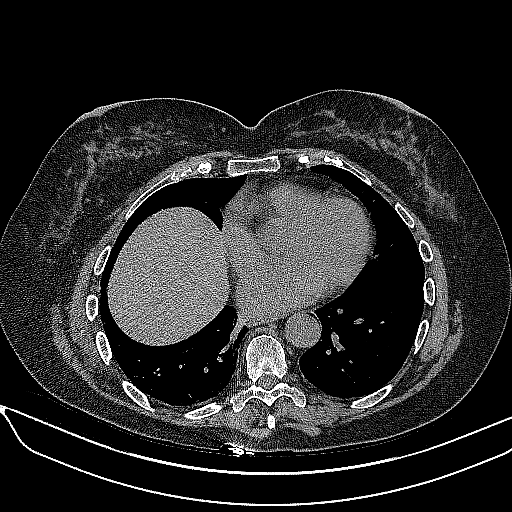
[im 99/264  lung]
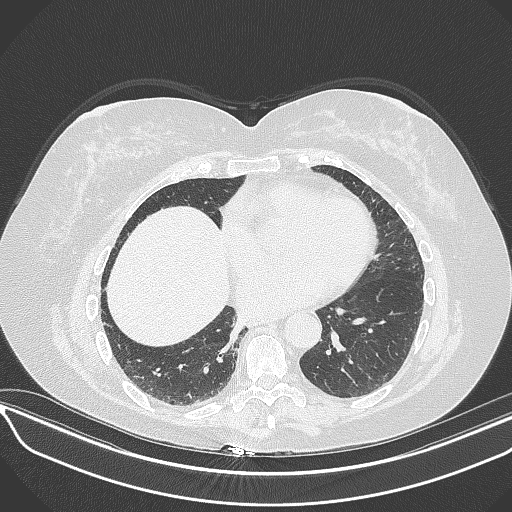
[im 116/264  lung]
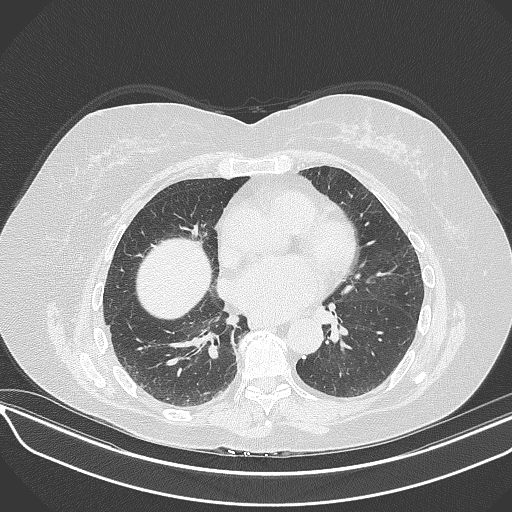
[im 123/264  lung]
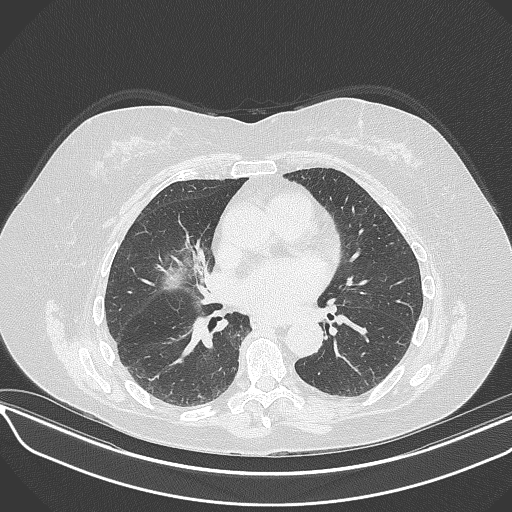
[im 132/264  lung]
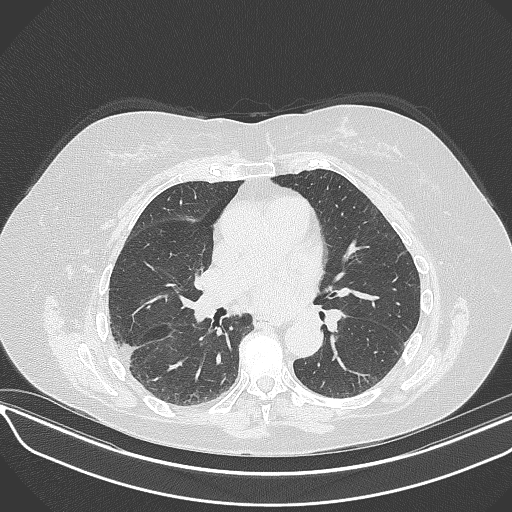
[im 148/264  mediastinal]
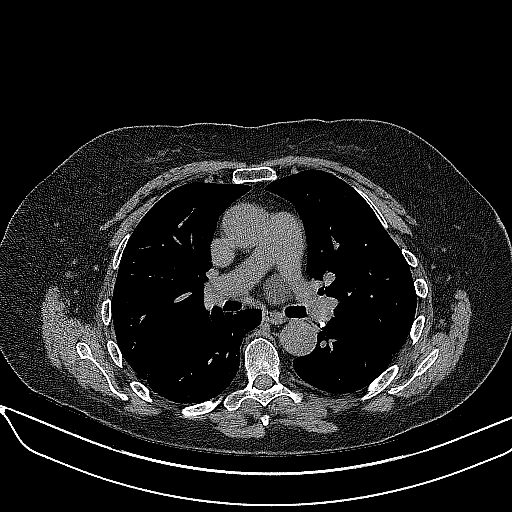
[im 148/264  lung]
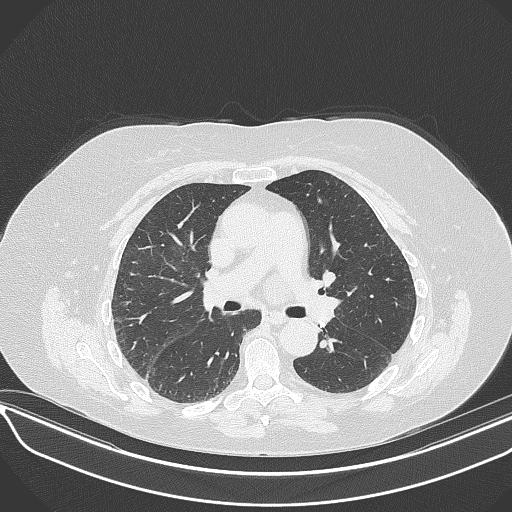
[im 165/264  lung]
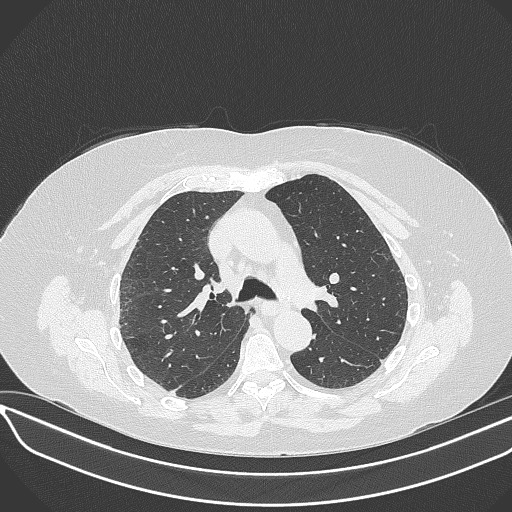
[im 198/264  lung]
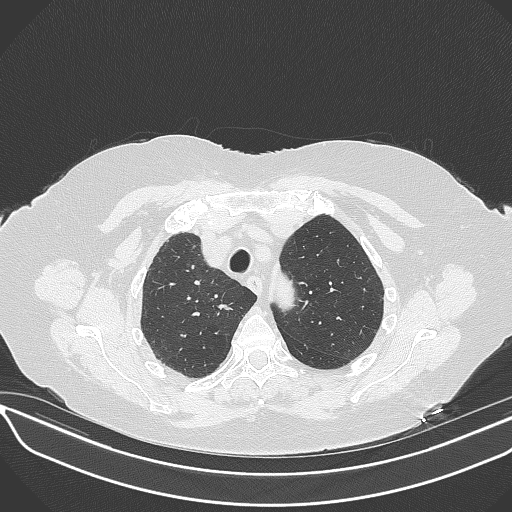
[im 214/264  lung]
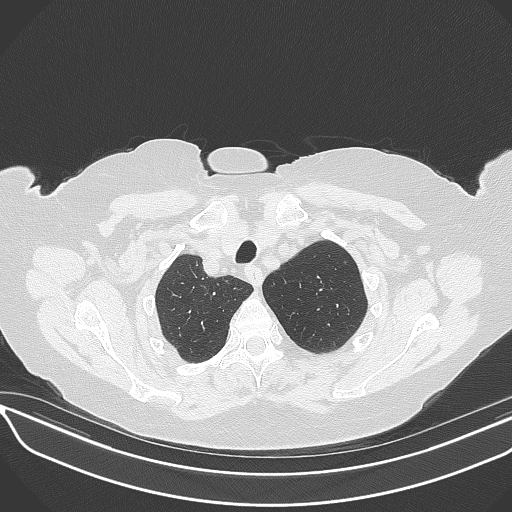
[im 231/264  mediastinal]
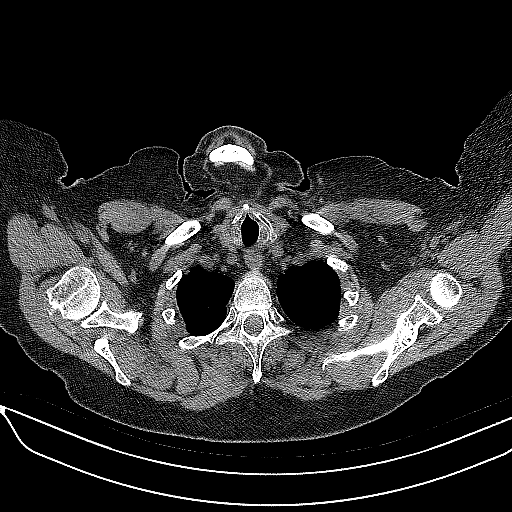
[im 231/264  lung]
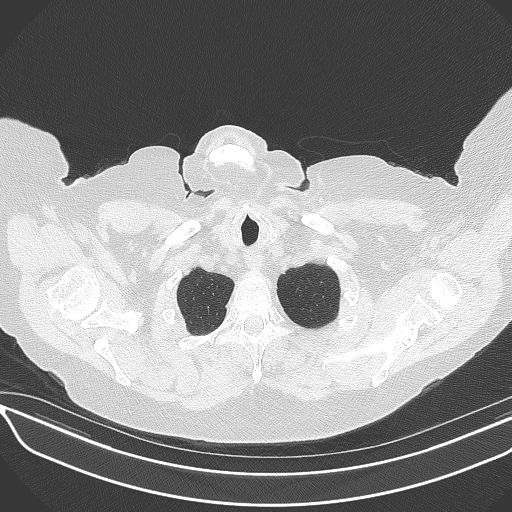
[im 247/264  lung]
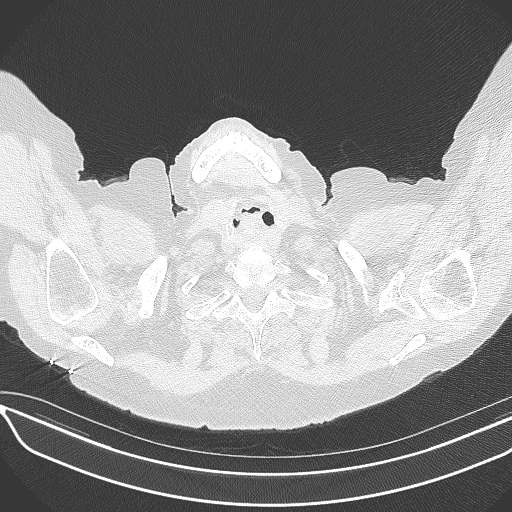

[14 of 30 positions shown; findings below may reference images not displayed]

FINDINGS: Cardiovascular: Normal heart size. No significant pericardial
effusion/thickening. Atherosclerotic nonaneurysmal thoracic aorta.
Normal caliber pulmonary arteries.

Mediastinum/Nodes: No discrete thyroid nodules. Unremarkable
esophagus. No pathologically enlarged axillary, mediastinal or hilar
lymph nodes, noting limited sensitivity for the detection of hilar
adenopathy on this noncontrast study. Coarsely calcified
granulomatous left hilar nodes, unchanged.

Lungs/Pleura: No pneumothorax. No pleural effusion. No acute
consolidative airspace disease, lung masses or significant pulmonary
nodules. Stable calcified subcentimeter posterior right lower lobe
granuloma. No significant lobular air trapping or evidence of
tracheobronchomalacia on the expiration sequence. Mild patchy
confluent subpleural reticulation and ground-glass opacity
throughout both lungs without significant regions of traction
bronchiectasis or frank honeycombing. Mild basilar predominance to
these findings. No convincing interval progression since [DATE]
chest CT.

Upper abdomen: Punctate splenic granulomas unchanged. Peripherally
coarsely calcified 1.6 cm splenic artery aneurysm is stable (series
2/image 121).

Musculoskeletal: No aggressive appearing focal osseous lesions.
Moderate thoracic spondylosis. Partially visualized surgical
hardware from ACDF.
IMPRESSION: 1. Mild patchy subpleural reticulation and ground-glass opacity in
both lungs with a mild basilar predominance. No significant
bronchiectasis or honeycombing. No convincing interval progression.
Findings are suggestive of a nonspecific mild interstitial lung
disease with differential including UIP, NSIP or sarcoidosis (none
of the findings are specific for sarcoidosis and the distribution is
atypical for sarcoidosis). Findings are indeterminate for UIP per
consensus guidelines: Diagnosis of Idiopathic Pulmonary Fibrosis: An
Official ATS/ERS/JRS/ALAT Clinical Practice Guideline. Am J Respir
Crit Care Med Vol 198, POBEE 5, [4O]-e[DATE].
2. Stable chronic calcified 1.6 cm splenic artery aneurysm.
3. Aortic Atherosclerosis ([4O]-[4O]).

## 2020-10-09 ENCOUNTER — Ambulatory Visit (INDEPENDENT_AMBULATORY_CARE_PROVIDER_SITE_OTHER): Payer: Medicare PPO | Admitting: Internal Medicine

## 2020-10-09 ENCOUNTER — Other Ambulatory Visit: Payer: Self-pay

## 2020-10-09 ENCOUNTER — Encounter: Payer: Self-pay | Admitting: Internal Medicine

## 2020-10-09 VITALS — BP 118/68 | HR 78 | Temp 98.3°F | Ht 61.0 in | Wt 156.0 lb

## 2020-10-09 DIAGNOSIS — J849 Interstitial pulmonary disease, unspecified: Secondary | ICD-10-CM | POA: Diagnosis not present

## 2020-10-09 LAB — PULMONARY FUNCTION TEST
DL/VA % pred: 105 %
DL/VA: 4.41 ml/min/mmHg/L
DLCO cor % pred: 87 %
DLCO cor: 14.82 ml/min/mmHg
DLCO unc % pred: 87 %
DLCO unc: 14.82 ml/min/mmHg
FEF 25-75 Pre: 1.92 L/sec
FEF2575-%Pred-Pre: 161 %
FEV1-%Pred-Pre: 104 %
FEV1-Pre: 1.7 L
FEV1FVC-%Pred-Pre: 115 %
FEV6-%Pred-Pre: 96 %
FEV6-Pre: 2 L
FEV6FVC-%Pred-Pre: 106 %
FVC-%Pred-Pre: 90 %
FVC-Pre: 2 L
Pre FEV1/FVC ratio: 85 %
Pre FEV6/FVC Ratio: 100 %

## 2020-10-09 NOTE — Progress Notes (Signed)
OV 10/18/2019  Subjective:  [name], female , DOB: [birth year] , age 80 y.o. , MRN: [id number] , ADDRESS: [address] [place] [address]   10/18/2019 -   Chief Complaint  Patient presents with   Consult    reports feeling at baseline today. reports cough stopped after completing recent course of abx.     [name] 80 y.o. - [place] immigrant.    Then around 22 years ago moved to [facility] and subsequently to [facility].  She is here because of an abnormal CT chest.  She states some 30 years ago while in [place] area she had a cough and then a chest x-ray showed something.  She was then admitted to the hospital had a bronchoscopy and apparently and the biopsy showed sarcoidosis.  She took steroids for 6 months and then after that the pulmonologist told her she was "done".  Then she was doing fine without any respiratory issues.  Then approximately 15 years ago went to [place] to see her mom and shortly after that just before her return she got sick with respiratory issues.  When she arrived in the room she was admitted to the hospital for 7 days.  She does not know any details.  She says the doctor never give any details.  She does not remember being on oxygen.  She was discharged on steroids which she took for "short while".  She was told that the steroids clouded her diagnosis.  No specific etiology was found but then she got better after the steroids course was done.  And she been doing fine ever since.  Then approximately 6-week ago she had some benefit surgery to her left side.  This was around the hip to remove some scar.  Some week or 2 after that for the last 4 to 6 weeks she has had cough and shortness of breath.  Shortness of breath is present on exertion.  There is no wheezing or proximal nocturnal dyspnea or orthopnea.  She is now on prednisone for the last 10 days.  With this the cough is resolved but the still shortness  of breath on exertion relieved by rest.  She has no past history of pulmonary embolism or cancer connective tissue disease.  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  In my personal visualization this was also present in the lung images of an abdominal CT from September 2020.  There are some elevation of the right hemidiaphragm.  She is noted to be on losartan.  Of note she is quite active and even does some gardening work.   SYMPTOM SCALE - ILD 10/18/2019   O2 use ra  Shortness of Breath 0 -> 5 scale with 5 being worst (score 6 If unable to do)  At rest 0  Simple tasks - showers, clothes change, eating, shaving 1  Household (dishes, doing bed, laundry) 1  Shopping 1  Walking level at own pace 2  Walking up Stairs *3  Total (30-36) Dyspnea Score 8  How bad is your cough? 0  How bad is your fatigue 2  How bad is nausea 0  How bad is vomiting?  0  How bad is diarrhea? 0  How bad is anxiety? 0  How bad is depression 0       Simple office walk 185 feet x  3 laps goal with forehead probe 10/18/2019  O2 used ra  Number laps completed 3  Comments about pace moder pace - briks  Resting Pulse Ox/HR 96% and 89/min  Final Pulse Ox/HR 96% and 98/min  Desaturated </= 88% no  Desaturated <= 3% points no  Got Tachycardic >/= 90/min yes  Symptoms at end of test No dyspnea, just tired  Miscellaneous comments x     11/27/2019  - Visit   79 year old female never smoker followed in our office for interstitial lung disease. She was last seen for initial consult in August/2021 by Dr. Marchelle Gearing. Plan of care from that office visit was as follows:  Fill out ILD questionnaire, stop prednisone today, do full pulmonary function testing, obtain autoimmune panel, follow-up in 4 to 8 weeks with Dr. Marchelle Gearing.  Patient's connective tissue lab work was essentially negative. Mildly elevated ANA. Titer of 1: 80, reviewed by Dr. Marchelle Gearing and felt  to be of no clinical significance. Patient presenting today after completing pulmonary function testing. Those results are listed below:  11/27/2019-pulmonary function test-FVC 1.95 (86% predicted), postbronchodilator ratio 88, postbronchodilator FEV1 1.83 (109% predicted), no bronchodilator response, TLC 3.82 (83% predicted), DLCO 12.41 (73% predicted), lower level of normal DLCO 14.15  Patient continues to report left abdominal side pain as well as left lower extremity leg pain numbness and tingling.  She plans to follow-up with primary care tomorrow.  She reports the numbness and tingling is worse at night.  She does have type 2 diabetes.  She reports that her blood sugars have been controlled but they have been persistently elevated in the past due to recurrent prednisone use.  She was recently prescribed Lasix by primary care but she did not notice much improvement.    She has an appointment with primary care tomorrow Dr. Genevie Ann to discuss this.  Patient is up-to-date with COVID-19 vaccinations as well as flu vaccine.  Patient reports that she is filled out the ILD questionnaire, and she dropped this back off at our office.  Walked in office today was able to complete 2 laps with oxygen levels dropping to 88% on room air, patient did not require oxygen  Tests:   10/18/2019-connective tissue labs-positive ANA, ANA titer 1: 80, nuclear, speckled Rest of connective tissue labs negative  09/25/2019-CT chest without contrast-mild bibasilar predominant irregular peripheral interstitial and groundglass opacity generally nonspecific and not characteristics and appearance of fibrotic pulmonary sarcoidosis, indeterminate for UIP pattern, consider follow-up ILD protocol CT chest in 1 year to observe stability of fibrotic findings, occasional scattered calcified and benign small pulmonary nodules, elevation of right hemidiaphragm, densely calcified aneurysm of the proximal splenic artery measuring 1.8 x 1.3  cm the upper abdomen  11/27/2019-pulmonary function test-FVC 1.95 (86% predicted), postbronchodilator ratio 88, postbronchodilator FEV1 1.83 (109% predicted), no bronchodilator response, TLC 3.82 (83% predicted), DLCO 12.41 (73% predicted), lower level of normal DLCO 14.15   ROS - per HPI  OV 06/20/2020  Subjective:  [name], female , DOB: February 23, 1941 , age 17 y.o. , MRN: [id number] , ADDRESS: [address] [place] [address] PCP Melida Quitter, MD Patient Care Team: Melida Quitter, MD as PCP - General (Internal Medicine) Jodelle Red, MD as PCP - Cardiology (Cardiology)  This Provider for this visit: Treatment Team:  Attending Provider: Kalman Shan, MD    06/20/2020 -   Chief Complaint  Patient presents with   Follow-up    Reports shortness of breath with activity. Also concerned about back pain, recently diagnosed with scoliosis.  Fu ILD NOS  [name] 80 y.o. -returns for followup. Since I last saw her has seen PCP Nadene Rubins, Nyoka Cowden, MD and APP in our office. Her breathing is stable. No cough. However, last 6 weeks has had back pain with some sciatica . Persistent. Severe pain esp with movement and twisting. Says has had MRI and shows lower back scolipsis. Has failed steroid shorts. Going to get 2nd opinion. Pain making dyspnea worse buyt overall feels ILD stable  Could not walk for walk test At home has difficulty wit ADL and changing clothes dye to back pain     ECHO Nov 2021  Echocardiogram 01/11/2020:  Normal LV systolic function with EF 61%. Left ventricle cavity is normal  in size. Mild concentric hypertrophy of the left ventricle. Normal global  wall motion.  Left atrial cavity is mildly dilated.  Trileaflet aortic valve.  Moderate (Grade III) aortic regurgitation.  Moderate (Grade II), posteriorly directed mitral regurgitation.  Mild tricuspid regurgitation.  No evidence of pulmonary hypertension   has a past  medical history of Arthritis, Cervical pain, and Hyperglycemia.   reports that she has never smoked. She has never used smokeless tobacco.   OV 10/09/2020  Subjective:  [name], female , DOB: [birth year] , age 41 y.o. , MRN: [id number] , ADDRESS: [address] [place] [address] PCP Melida Quitter, MD Patient Care Team: Melida Quitter, MD as PCP - General (Internal Medicine) Jodelle Red, MD as PCP - Cardiology (Cardiology)  This Provider for this visit: Treatment Team:  Attending Provider: Kalman Shan, MD    10/09/2020 -   Chief Complaint  Patient presents with   Follow-up    PFT performed today.  Pt states she has been about the same since last visit.   Mild undifferentiated ILD.  On observation and expectant follow-up supportive care.  [name] 80 y.o. -returns for follow-up.  Since her last time symptom score a year ago symptoms are slightly worse but this because of worsening back pain.  Overall she says she feels stable in terms of shortness of breath.  She had a pulmonary function test that is stable.  She had high-resolution CT chest.  There is only mild ILD and it is also stable.  We discussed that antifibrotic therapies are indicated if there is progressive phenotype or if there is high suspicion for IPF.  She does not meet these indications.  In addition we discussed the safety profile of antifibrotic's and based on this this is not consistent with her current quality of life.  She is quite quite hampered by her low back pain.      SYMPTOM SCALE - ILD 10/18/2019  10/09/2020    O2 use ra ra  Shortness of Breath 0 -> 5 scale with 5 being worst (score 6 If unable to do)   At rest 0 2  Simple tasks - showers, clothes change, eating, shaving 1 2  Household (dishes, doing bed, laundry) 1 3  Shopping 1 3  Walking level at own pace 2 1  Walking up Stairs *3 1  Total (30-36) Dyspnea Score 8 12  How bad is your cough? 0 0   How bad is your fatigue 2 3  How bad is nausea 0 1  How bad is vomiting?  0 0  How bad is diarrhea? 0 0  How bad is anxiety? 0 0  How bad is depression 0 0  Simple office walk 185 feet x  3 laps goal with forehead probe 10/18/2019  06/20/2020   O2 used ra ra  Number laps completed 3 Attempted 3 byt did 1 only  Comments about pace moder pace - briks   Resting Pulse Ox/HR 96% and 89/min 97% and 78  Final Pulse Ox/HR 96% and 98/min 97% and 90  Desaturated </= 88% no no  Desaturated <= 3% points no no  Got Tachycardic >/= 90/min yes yes  Symptoms at end of test No dyspnea, just tired Stopped due to severe back pain and nausea  Miscellaneous comments x Scoliosis issue       PFT  PFT Results Latest Ref Rng & Units 10/09/2020 11/27/2019  FVC-Pre L 2.00 1.95  FVC-Predicted Pre % 90 86  FVC-Post L - 2.08  FVC-Predicted Post % - 92  Pre FEV1/FVC % % 85 84  Post FEV1/FCV % % - 88  FEV1-Pre L 1.70 1.63  FEV1-Predicted Pre % 104 98  FEV1-Post L - 1.83  DLCO uncorrected ml/min/mmHg 14.82 12.41  DLCO UNC% % 87 73  DLCO corrected ml/min/mmHg 14.82 12.41  DLCO COR %Predicted % 87 73  DLVA Predicted % 105 77  TLC L - 3.82  TLC % Predicted % - 83  RV % Predicted % - 82   CT chest high resol 09/25/20  Narrative & Impression  CLINICAL DATA:  Sarcoidosis. Follow-up possible interstitial lung disease.   EXAM: CT CHEST WITHOUT CONTRAST   TECHNIQUE: Multidetector CT imaging of the chest was performed following the standard protocol without intravenous contrast. High resolution imaging of the lungs, as well as inspiratory and expiratory imaging, was performed.   COMPARISON:  09/25/2019 chest CT.   FINDINGS: Cardiovascular: Normal heart size. No significant pericardial effusion/thickening. Atherosclerotic nonaneurysmal thoracic aorta. Normal caliber pulmonary arteries.   Mediastinum/Nodes: No discrete thyroid nodules. Unremarkable esophagus. No pathologically enlarged  axillary, mediastinal or hilar lymph nodes, noting limited sensitivity for the detection of hilar adenopathy on this noncontrast study. Coarsely calcified granulomatous left hilar nodes, unchanged.   Lungs/Pleura: No pneumothorax. No pleural effusion. No acute consolidative airspace disease, lung masses or significant pulmonary nodules. Stable calcified subcentimeter posterior right lower lobe granuloma. No significant lobular air trapping or evidence of tracheobronchomalacia on the expiration sequence. Mild patchy confluent subpleural reticulation and ground-glass opacity throughout both lungs without significant regions of traction bronchiectasis or frank honeycombing. Mild basilar predominance to these findings. No convincing interval progression since 09/25/2019 chest CT.   Upper abdomen: Punctate splenic granulomas unchanged. 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   Musculoskeletal: No aggressive appearing focal osseous lesions. Moderate thoracic spondylosis. Partially visualized surgical hardware from ACDF.   IMPRESSION: 1. Mild patchy subpleural reticulation and ground-glass opacity in both lungs with a mild basilar predominance. No significant bronchiectasis or honeycombing. No convincing interval progression. Findings are suggestive of a nonspecific mild interstitial lung disease with differential including UIP, NSIP or sarcoidosis (none of the findings are specific for sarcoidosis and the distribution is atypical for sarcoidosis). Findings are indeterminate for UIP per consensus guidelines: Diagnosis of Idiopathic Pulmonary Fibrosis: An Official ATS/ERS/JRS/ALAT Clinical Practice Guideline. Am Rosezetta Schlatter Crit Care Med Vol 198, Iss 5, [phone], Oct 31 2016. 2. 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 3. Aortic Atherosclerosis (ICD10-I70.0).     Electronically Signed   By: Delbert Phenix M.D.   On:  09/26/2020 21:58        has a past medical  history of Arthritis, Cervical pain, and Hyperglycemia.   reports that she has never smoked. She has never used smokeless tobacco.  Past Surgical History:  Procedure Laterality Date   ABDOMINAL HYSTERECTOMY     Per patient   SHOULDER SURGERY      No Known Allergies  Immunization History  Administered Date(s) Administered   Fluad Quad(high Dose 65+) 11/13/2019   Influenza, Quadrivalent, Recombinant, Inj, Pf 11/10/2019   Janssen (J&J) SARS-COV-2 Vaccination 05/12/2019   Moderna Sars-Covid-2 Vaccination 05/24/2019, 12/24/2019   Pneumococcal Conjugate-13 11/13/2019   Pneumococcal-Unspecified 11/10/2019    Family History  Problem Relation Age of Onset   Diabetes Father    Leukemia Brother      Current Outpatient Medications:    ACCU-CHEK AVIVA PLUS test strip, , Disp: , Rfl:    Accu-Chek FastClix Lancets MISC, , Disp: , Rfl:    Estrogens Conjugated (PREMARIN PO), Take by mouth., Disp: , Rfl:    EYSUVIS 0.25 % SUSP, , Disp: , Rfl:    furosemide (LASIX) 20 MG tablet, Take 20 mg by mouth daily., Disp: , Rfl:    GLIPIZIDE PO, Take by mouth. Only occasionally, Disp: , Rfl:    losartan (COZAAR) 25 MG tablet, , Disp: , Rfl:    meclizine (ANTIVERT) 12.5 MG tablet, Take 1 tablet (12.5 mg total) by mouth 3 (three) times daily as needed for dizziness., Disp: 30 tablet, Rfl: 0   Vitamin D, Ergocalciferol, (DRISDOL) 1.25 MG (50000 UNIT) CAPS capsule, , Disp: , Rfl:       Objective:   Vitals:   10/09/20 1614  BP: 118/68  Pulse: 78  Temp: 98.3 F (36.8 C)  TempSrc: Oral  SpO2: 97%  Weight: 156 lb (70.8 kg)  Height:  (1.549 m)    Estimated body mass index is 29.48 kg/m as calculated from the following:   Height as of this encounter:  (1.549 m).   Weight as of this encounter: 156 lb (70.8 kg).  @  [employer]   10/09/20 1614  Weight: 156 lb (70.8 kg)     Physical Exam    General: No distress.  Looks well Neuro: Alert and Oriented x 3. GCS 15. Speech normal Psych: Pleasant Resp:  Barrel Chest - no.  Wheeze - no, Crackles - no, No overt respiratory distress CVS: Normal heart sounds. Murmurs - no Ext: Stigmata of Connective Tissue Disease - no HEENT: Normal upper airway. PEERL +. No post nasal drip        Assessment:       ICD-10-CM   1. ILD (interstitial lung disease) (HCC)  J84.9          Plan:     Patient Instructions  ILD (interstitial lung disease) (HCC)  -Clinically undifferentiated, mild and stable x  1 year  Plan -return for 15 min followup in 9 months  - symptom score and walk test at followup  - based on this can decide on CT/PFT etc - overall I do not recommend anti fibrotic treatment now due to stability and side effect profile  - defer pulm rehab due to back pain issues   Follow-up - Return in 9 months - 15 min slot    SIGNATURE    Dr. Kalman Shan, M.D., F.C.C.P,  Pulmonary and Critical Care Medicine Staff Physician, [facility] Director - Interstitial Lung Disease  Program  Pulmonary Fibrosis [facility] at [facility] [place], [place], [address]  Pager: [phone], If no  answer or between  15:00h - 7:00h: call [phone] Telephone: [phone]  5:01 PM 10/09/2020

## 2020-10-09 NOTE — Patient Instructions (Addendum)
ILD (interstitial lung disease) (HCC)  -Clinically undifferentiated, mild and stable x  1 year  Plan -return for 15 min followup in 9 months  - symptom score and walk test at followup  - based on this can decide on CT/PFT etc - overall I do not recommend anti fibrotic treatment now due to stability and side effect profile  - defer pulm rehab due to back pain issues   Follow-up - Return in 9 months - 15 min slot

## 2020-10-09 NOTE — Patient Instructions (Signed)
Spirometry/DLCO performed today,

## 2020-10-09 NOTE — Progress Notes (Signed)
Spirometry/DLCO performed today. 

## 2020-10-10 ENCOUNTER — Telehealth: Payer: Self-pay | Admitting: Internal Medicine

## 2020-10-10 NOTE — Telephone Encounter (Signed)
Called and spoke with pt letting her know that she needed to contact  PCP to see if they were okay refilling med and she verbalized understanding. Nothing further needed.

## 2020-10-10 NOTE — Telephone Encounter (Signed)
Pt just had an appt with Dr. Marchelle Gearing yesterday on (10/09/2020) and forgot to mention that she is in need of a rx for; Cortizone 5 MG; stated that she did contact the pharmacy before calling us and there is no refills for the medication.  Pharmacy; [id number] - [place], [place] - [address] AT [facility] OF [address]   Pls regard; [phone]

## 2021-01-20 ENCOUNTER — Other Ambulatory Visit: Payer: Self-pay | Admitting: Internal Medicine

## 2021-01-20 DIAGNOSIS — K862 Cyst of pancreas: Secondary | ICD-10-CM

## 2021-01-26 NOTE — Progress Notes (Signed)
Cardiology Office Note:    Date:  01/27/2021   ID:  Adriana Donovan, DOB [birth year], MRN [id number]  PCP:  Melida Quitter, MD [facility] Cardiologist: Jodelle Red, MD   Reason for visit: 46-month follow-up  History of Present Illness:    Adriana Donovan is a 80 y.o. female with a hx of sarcoidosis (requiring 2 hospitalizations since age of 64), type II diabetes, hypertension, interstitial lung disease.  She was initially referred to [place] for management of bilateral lower extremity edema.  She had an echo done by [facility] 01/12/20 as noted below, normal EF, moderate AR, moderate MR.  She last saw Dr. Janne Napoleon in May 2022 and mentioned shortness of breath with exertion and lower extremity edema.  Today, she states she is doing well.  Most of her limiting symptoms are from her scoliosis which causes her chronic back pain.  She keeps up with her house and cooking without significant shortness of breath.  No chest pain.  She has mild lower extremity edema which she takes Lasix 20 mg daily for.  She typically wears compression stockings.  She states she has a very healthy diet and is active.  She denies PND, orthopnea and weight changes.  She has considered surgery for scoliosis but it would be a 2-day extensive surgery.     Past Medical History:  Diagnosis Date   Arthritis    Cervical pain    Hyperglycemia     Past Surgical History:  Procedure Laterality Date   ABDOMINAL HYSTERECTOMY     Per patient   SHOULDER SURGERY      Current Medications: Current Meds  Medication Sig   ACCU-CHEK AVIVA PLUS test strip    Accu-Chek FastClix Lancets MISC    Cholecalciferol 25 MCG (1000 UT) capsule TK 1 C PO QD   Estrogens Conjugated (PREMARIN PO) Take by mouth.   EYSUVIS 0.25 % SUSP    furosemide (LASIX) 20 MG tablet Take 20 mg by mouth daily.   GLIPIZIDE PO Take by mouth. Only occasionally   losartan (COZAAR) 25 MG tablet    meclizine (ANTIVERT)  12.5 MG tablet Take 1 tablet (12.5 mg total) by mouth 3 (three) times daily as needed for dizziness.   Vitamin D, Ergocalciferol, (DRISDOL) 1.25 MG (50000 UNIT) CAPS capsule      Allergies:   Rosuvastatin   Social History   Socioeconomic History   Marital status: Married    Spouse name: Not on file   Number of children: Not on file   Years of education: Not on file   Highest education level: Not on file  Occupational History   Not on file  Tobacco Use   Smoking status: Never   Smokeless tobacco: Never  Vaping Use   Vaping Use: Never used  Substance and Sexual Activity   Alcohol use: Never   Drug use: Never   Sexual activity: Not on file  Other Topics Concern   Not on file  Social History Narrative   Not on file   Social Determinants of Health   Financial Resource Strain: Not on file  Food Insecurity: Not on file  Transportation Needs: Not on file  Physical Activity: Not on file  Stress: Not on file  Social Connections: Not on file     Family History: The patient's family history includes Diabetes in her father; Leukemia in her brother.  ROS:   Please see the history of present illness.     EKGs/Labs/Other Studies Reviewed:  Recent Labs: No results found for requested labs within last 8760 hours.   Recent Lipid Panel No results found for: CHOL, TRIG, HDL, LDLCALC, LDLDIRECT  Physical Exam:    VS:  BP 132/66   Pulse 77   Ht 5\' 2"  (1.575 m)   Wt 152 lb 9.6 oz (69.2 kg)   SpO2 97%   BMI 27.91 kg/m    No data found.  Wt Readings from Last 3 Encounters:  01/27/21 152 lb 9.6 oz (69.2 kg)  10/09/20 156 lb (70.8 kg)  07/30/20 162 lb (73.5 kg)     GEN:  Well nourished, well developed in no acute distress HEENT: Normal NECK: No JVD; No carotid bruits CARDIAC: RRR, systolic murmur, 2+ pedal pulses bilaterally RESPIRATORY:  Clear to auscultation without rales, wheezing or rhonchi  ABDOMEN: Soft, non-tender, non-distended MUSCULOSKELETAL: 1+ edema to  low shin bilaterally, ruddy appearance, varicosities SKIN: Warm and dry NEUROLOGIC:  Alert and oriented PSYCHIATRIC:  Normal affect     ASSESSMENT AND PLAN   Bilateral lower extremity edema, mild -Continue low-dose Lasix and compression stockings.  Continue salt restriction.  Valvular heart disease Mitral valve insufficiency Aortic valve insufficiency -Echo 01/2020: EF 61%, Mild concentric hypertrophy of the left ventricle, Left atrial cavity is mildly dilated. Trileaflet aortic valve.  Moderate (Grade III) aortic regurgitation. Moderate (Grade II), posteriorly directed mitral regurgitation. Mild tricuspid regurgitation. No evidence of pulmonary hypertension. -No heart failure symptoms.  Will consider repeat echo in 1 year  Hypertension, well controlled -Continue losartan particularly with her history of diabetes and microalbuminemia. -Goal BP is <130/80.  Recommend DASH diet (high in vegetables, fruits, low-fat dairy products, whole grains, poultry, fish, and nuts and low in sweets, sugar-sweetened beverages, and red meats), salt restriction and increase physical activity.   Disposition - Follow-up in 1 year with symptoms develop prior.        Medication Adjustments/Labs and Tests Ordered: Current medicines are reviewed at length with the patient today.  Concerns regarding medicines are outlined above.  No orders of the defined types were placed in this encounter.  No orders of the defined types were placed in this encounter.   Patient Instructions  Medication Instructions:  No Changes *If you need a refill on your cardiac medications before your next appointment, please call your pharmacy*   Lab Work: No labs If you have labs (blood work) drawn today and your tests are completely normal, you will receive your results only by: MyChart Message (if you have MyChart) OR A paper copy in the mail If you have any lab test that is abnormal or we need to change your treatment,  we will call you to review the results.   Testing/Procedures: No Testing   Follow-Up: At [facility], you and your health needs are our priority.  As part of our continuing mission to provide you with exceptional heart care, we have created designated Provider Care Teams.  These Care Teams include your primary Cardiologist (physician) and Advanced Practice Providers (APPs -  Physician Assistants and Nurse Practitioners) who all work together to provide you with the care you need, when you need it.  We recommend signing up for the patient portal called "MyChart".  Sign up information is provided on this After Visit Summary.  MyChart is used to connect with patients for Virtual Visits (Telemedicine).  Patients are able to view lab/test results, encounter notes, upcoming appointments, etc.  Non-urgent messages can be sent to your provider as well.   To learn  more about what you can do with MyChart, go to [email or web address].    Your next appointment:   1 year(s)  The format for your next appointment:   In Person  Provider:   Jodelle Red, MD      Signed, Cannon Kettle, PA-C  01/27/2021 11:32 AM    [facility]

## 2021-01-27 ENCOUNTER — Ambulatory Visit (INDEPENDENT_AMBULATORY_CARE_PROVIDER_SITE_OTHER): Payer: Medicare PPO | Admitting: Physician Assistant

## 2021-01-27 ENCOUNTER — Encounter: Payer: Self-pay | Admitting: Physician Assistant

## 2021-01-27 ENCOUNTER — Other Ambulatory Visit: Payer: Self-pay

## 2021-01-27 VITALS — BP 132/66 | HR 77 | Ht 62.0 in | Wt 152.6 lb

## 2021-01-27 DIAGNOSIS — I340 Nonrheumatic mitral (valve) insufficiency: Secondary | ICD-10-CM | POA: Diagnosis not present

## 2021-01-27 DIAGNOSIS — R0609 Other forms of dyspnea: Secondary | ICD-10-CM | POA: Diagnosis not present

## 2021-01-27 DIAGNOSIS — R600 Localized edema: Secondary | ICD-10-CM

## 2021-01-27 DIAGNOSIS — I351 Nonrheumatic aortic (valve) insufficiency: Secondary | ICD-10-CM

## 2021-01-27 NOTE — Patient Instructions (Signed)
Medication Instructions:  No Changes *If you need a refill on your cardiac medications before your next appointment, please call your pharmacy*   Lab Work: No labs If you have labs (blood work) drawn today and your tests are completely normal, you will receive your results only by: MyChart Message (if you have MyChart) OR A paper copy in the mail If you have any lab test that is abnormal or we need to change your treatment, we will call you to review the results.   Testing/Procedures: No Testing   Follow-Up: At [facility], you and your health needs are our priority.  As part of our continuing mission to provide you with exceptional heart care, we have created designated Provider Care Teams.  These Care Teams include your primary Cardiologist (physician) and Advanced Practice Providers (APPs -  Physician Assistants and Nurse Practitioners) who all work together to provide you with the care you need, when you need it.  We recommend signing up for the patient portal called "MyChart".  Sign up information is provided on this After Visit Summary.  MyChart is used to connect with patients for Virtual Visits (Telemedicine).  Patients are able to view lab/test results, encounter notes, upcoming appointments, etc.  Non-urgent messages can be sent to your provider as well.   To learn more about what you can do with MyChart, go to [email or web address].    Your next appointment:   1 year(s)  The format for your next appointment:   In Person  Provider:   Jodelle Red, MD

## 2021-01-29 ENCOUNTER — Ambulatory Visit (INDEPENDENT_AMBULATORY_CARE_PROVIDER_SITE_OTHER): Payer: Medicare PPO

## 2021-01-29 ENCOUNTER — Ambulatory Visit (INDEPENDENT_AMBULATORY_CARE_PROVIDER_SITE_OTHER): Payer: Medicare PPO | Admitting: Nurse Practitioner

## 2021-01-29 ENCOUNTER — Encounter: Payer: Self-pay | Admitting: Nurse Practitioner

## 2021-01-29 ENCOUNTER — Other Ambulatory Visit: Payer: Self-pay

## 2021-01-29 ENCOUNTER — Telehealth: Payer: Self-pay | Admitting: Internal Medicine

## 2021-01-29 VITALS — BP 120/60 | HR 76 | Temp 98.3°F | Ht 62.0 in | Wt 151.8 lb

## 2021-01-29 DIAGNOSIS — J849 Interstitial pulmonary disease, unspecified: Secondary | ICD-10-CM | POA: Diagnosis not present

## 2021-01-29 DIAGNOSIS — J40 Bronchitis, not specified as acute or chronic: Secondary | ICD-10-CM

## 2021-01-29 DIAGNOSIS — R0609 Other forms of dyspnea: Secondary | ICD-10-CM

## 2021-01-29 DIAGNOSIS — J019 Acute sinusitis, unspecified: Secondary | ICD-10-CM | POA: Diagnosis not present

## 2021-01-29 LAB — POC INFLUENZA A&B (BINAX/QUICKVUE)
Influenza A, POC: NEGATIVE
Influenza B, POC: NEGATIVE

## 2021-01-29 LAB — POC COVID19 BINAXNOW: SARS Coronavirus 2 Ag: NEGATIVE

## 2021-01-29 IMAGING — DX DG CHEST 2V
2 series · 2 of 2 positions shown · non-contrast
Comparison: Chest CT [DATE]

CLINICAL DATA: Cough, congestion and intermittent shortness of
breath.

EXAM:
CHEST - 2 VIEW

[chest pa]
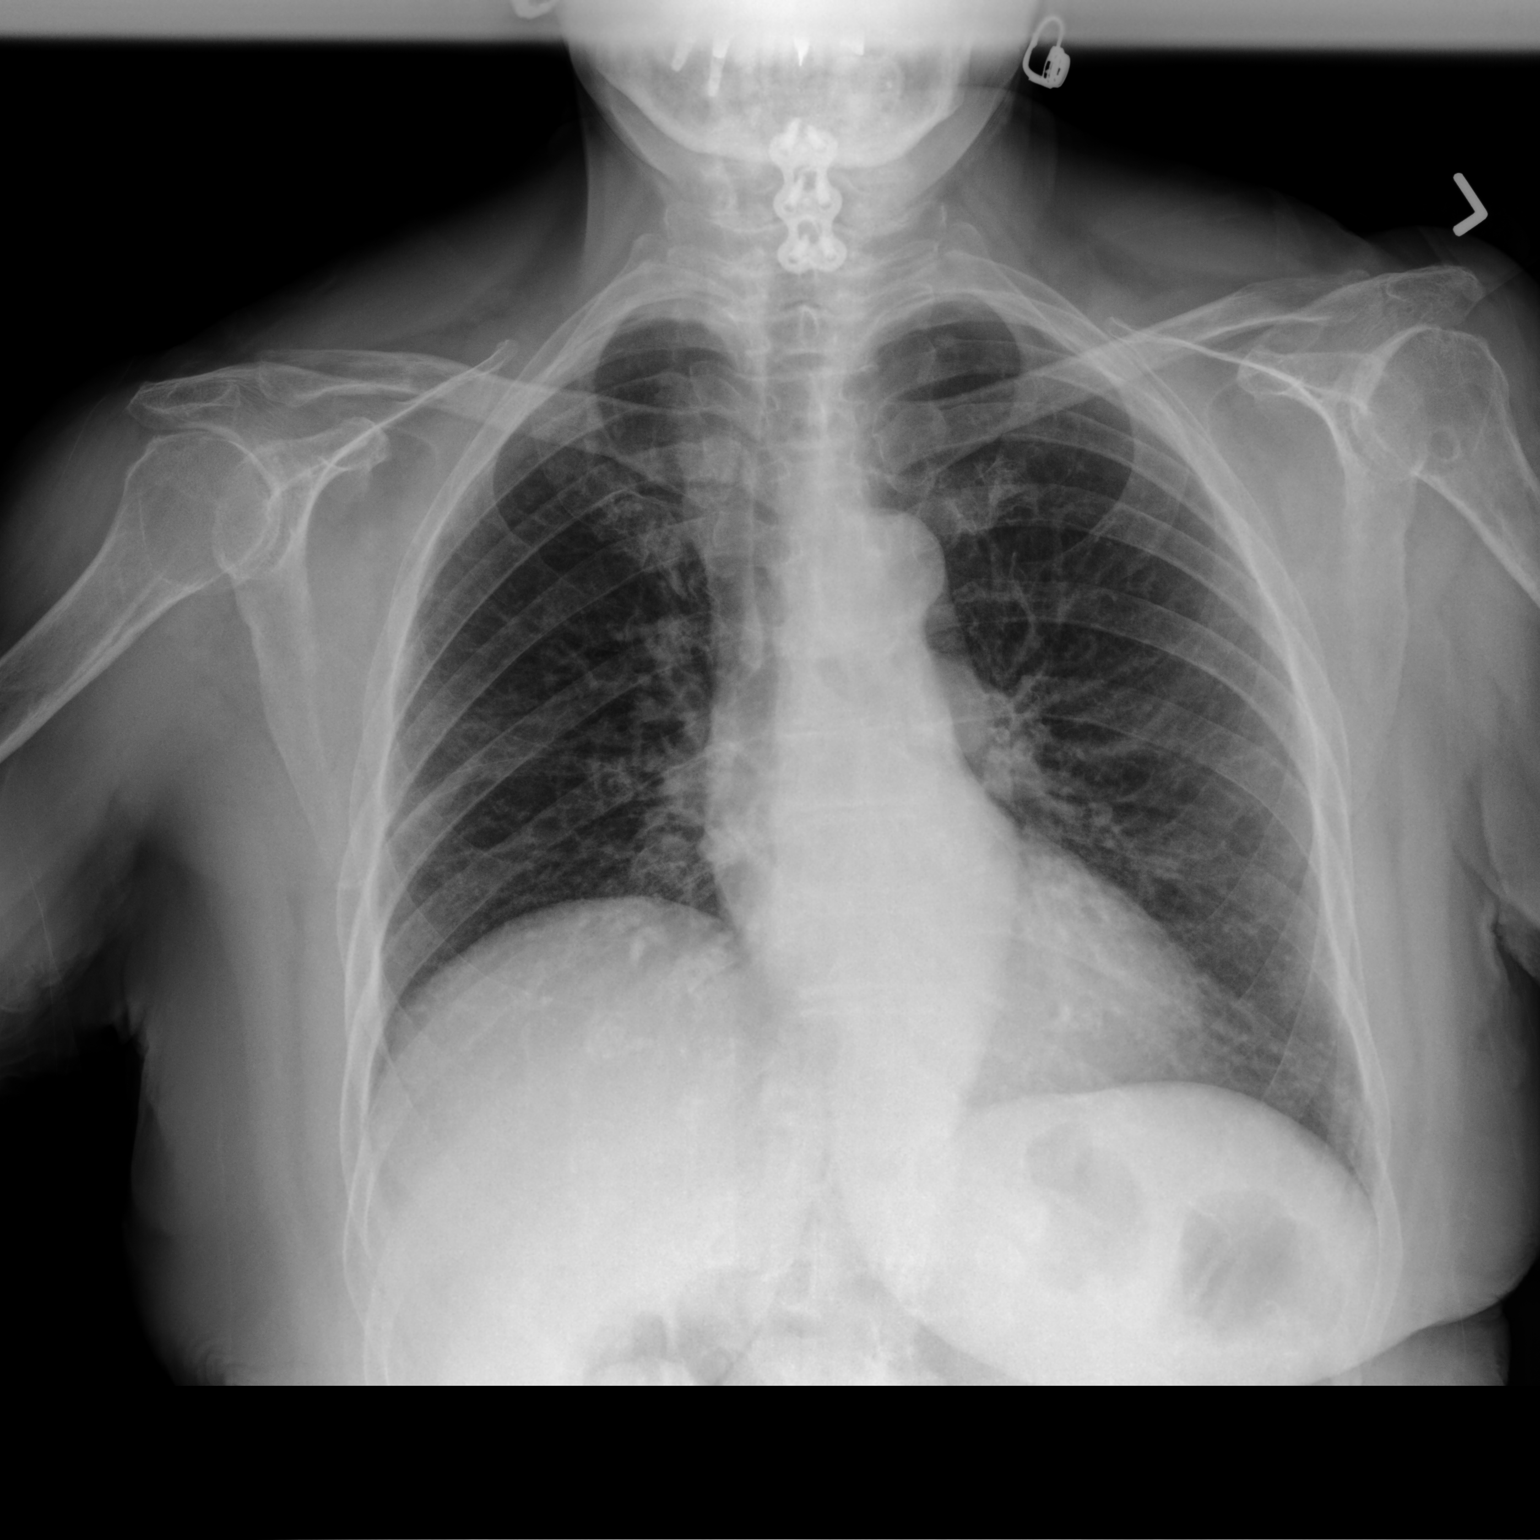

[chest lat]
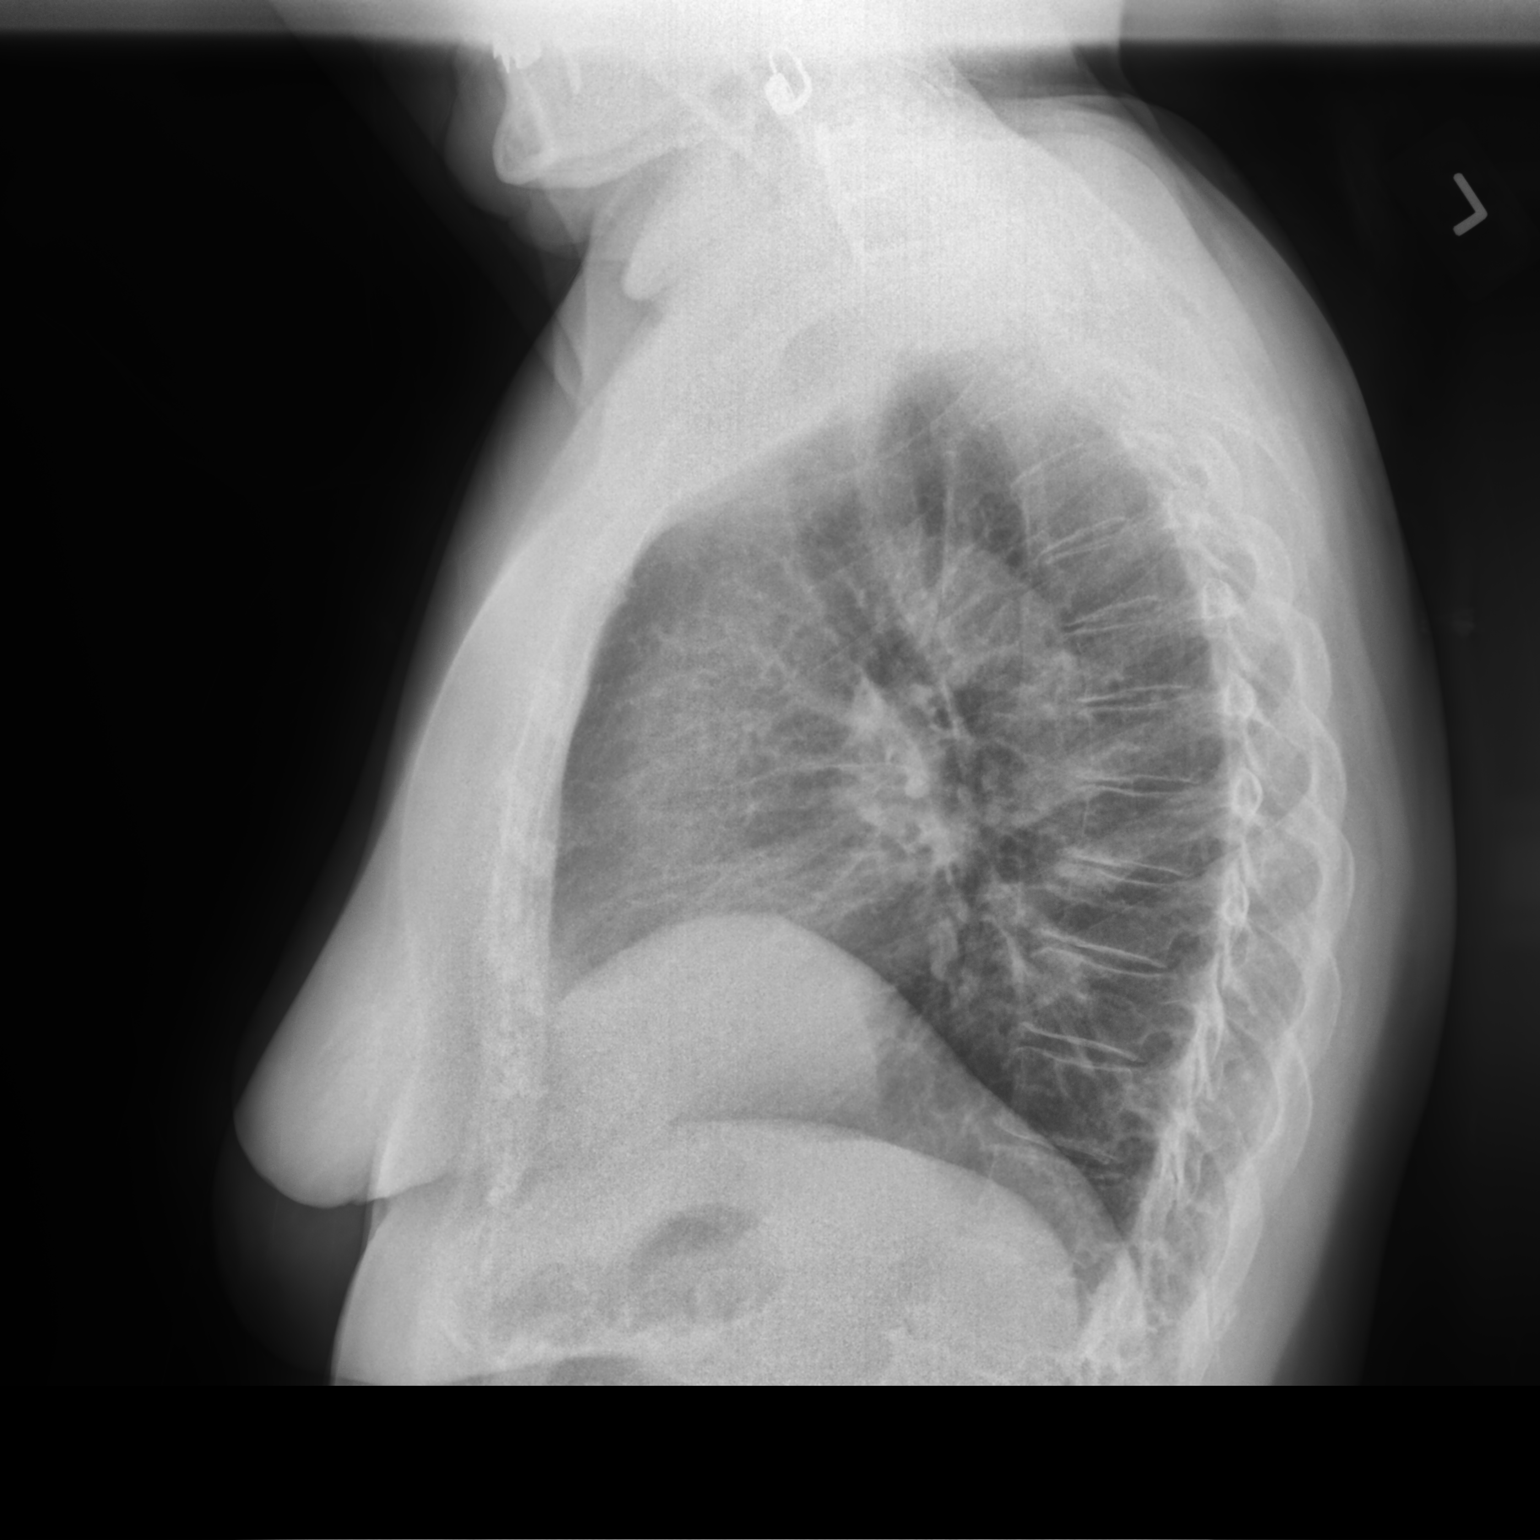

[2 of 2 positions shown; findings below may reference images not displayed]

FINDINGS: The heart is normal in size. The mediastinal and hilar contours are
within normal limits. Mild chronic bronchitic type changes but no
infiltrates, edema or effusions. The bony thorax is intact.
IMPRESSION: No acute cardiopulmonary findings.

## 2021-01-29 MED ORDER — ALBUTEROL SULFATE (2.5 MG/3ML) 0.083% IN NEBU: 2.5000 mg | INHALATION_SOLUTION | Freq: Four times a day (QID) | RESPIRATORY_TRACT | 12 refills | Status: DC | PRN

## 2021-01-29 MED ORDER — PREDNISONE 20 MG PO TABS: 40.0000 mg | ORAL_TABLET | Freq: Every day | ORAL | 0 refills | Status: AC

## 2021-01-29 MED ORDER — ALBUTEROL SULFATE HFA 108 (90 BASE) MCG/ACT IN AERS: 2.0000 | INHALATION_SPRAY | Freq: Four times a day (QID) | RESPIRATORY_TRACT | 2 refills | Status: DC | PRN

## 2021-01-29 NOTE — Assessment & Plan Note (Addendum)
Supportive care. Prednisone 40 mg for 5 days. Educated to monitor blood sugars three times a day.  No indication for abx therapy at this time. CXR today showed no acute cardiopulmonary findings. Flu A/B and COVID swabs negative today. Advised to obtain COVID PCR. Follow up sooner if symptoms do not improve or worsen.    Patient Instructions  -Albuterol inhaler 2 puffs every 6 hours as needed for shortness of breath or wheezing  -Albuterol nebulizer 3 mL twice a day until symptoms improve then as needed every six hours for shortness of breath or wheezing  -Mucinex DM over the counter twice a day  -Chlortab 4 mg over the counter At bedtime for cough  -Saline nasal spray 2-3 times a day -Flonase 1-2 sprays each nostril daily until symptoms improve   Prednisone 40 mg for 5 days. Take with food. Monitor your blood sugars three times a day while on the prednisone and notify if consistently above 180. You can return to checking them twice a day once you finish.   Chest x ray today. We will notify you of your results.   Rapid flu A/B and COVID today.   Follow up in 3 months with Dr. Marchelle Gearing. If symptoms do not improve or worsen, please contact office for sooner follow up with APP or seek emergency care.

## 2021-01-29 NOTE — Assessment & Plan Note (Signed)
Minimal symptoms with clear drainage. Supportive care initiated. See above plan.

## 2021-01-29 NOTE — Assessment & Plan Note (Signed)
Stable pulmonary symptoms prior to this. No current maintenance regimen in place. See above plan.

## 2021-01-29 NOTE — Progress Notes (Signed)
Spoke with pt and she voices understanding. Pt did not have any further questions.

## 2021-01-29 NOTE — Patient Instructions (Addendum)
-  Albuterol inhaler 2 puffs every 6 hours as needed for shortness of breath or wheezing  -Albuterol nebulizer 3 mL twice a day until symptoms improve then as needed every six hours for shortness of breath or wheezing  -Mucinex DM over the counter twice a day  -Chlortab 4 mg over the counter At bedtime for cough  -Saline nasal spray 2-3 times a day -Flonase 1-2 sprays each nostril daily until symptoms improve   Prednisone 40 mg for 5 days. Take with food. Monitor your blood sugars three times a day while on the prednisone and notify if consistently above 180. You can return to checking them twice a day once you finish.   Chest x ray today. We will notify you of your results.   Rapid flu A/B and COVID today negative. Consider COVID PCR.   Follow up in 3 months with Dr. Marchelle Gearing. If symptoms do not improve or worsen, please contact office for sooner follow up with APP or seek emergency care.

## 2021-01-29 NOTE — Progress Notes (Addendum)
@Patient  ID: , female    DOB: 13-Dec-1940, 80 y.o.   MRN: [id number]  Chief Complaint  Patient presents with   Follow-up    She reports that she has lost her voice in am and Monday she noticed shortness of breath, she has dry cough,     Referring provider: Tuesday, MD  HPI: 80 year old female, never smoker followed for interstitial lung disease. She is a patient of Dr. 94 and was last seen in office by him on 10/09/20.  Past medical history significant for aortic valve regurgitation, mitral valve insufficiency, arthritis, lumbar stenosis, HTN, DM II. She is followed by cardiology.   TEST/EVENTS:  09/25/2019 CT chest without contrast: Mild bibasilar predominant irregular peripheral interstitial and groundglass opacity, generally nonspecific and not characteristic in appearance of fibrotic pulmonary sarcoidosis.  Indeterminate for UIP pattern.  Consider follow-up of ILD protocol CT chest in 1 year to observe stability of fibrotic findings.  Occasional scattered calcified and benign small pulmonary nodules.  Elevation of right hemidiaphragm.  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 10/18/2019 connective tissue labs: Positive ANA, ANA titer 1:80, nuclear, speckled.  Other connective tissue labs negative. 11/27/2019 PFTs: FVC 1.95 (86), post BD ratio 88, post BD FEV one 1.83 (109%), TLC 3.82 (83%), DLCO 12.41 (73).  No BD 09/25/2020 HRCT: Atherosclerosis nonaneurysmal thoracic aorta.  No lymphadenopathy.  Coarsely calcified granulomatosis left hilar nodes.  No acute consolidative airspace disease, lung masses or significant pulmonary nodules.  Stable calcified subcentimeter posterior right lower lobe granuloma.  No significant lobular air trapping or evidence of tracheobronchial malacia on the expiration sequence.  Mild patchy confluent subpleural reticulation and groundglass opacity throughout both lungs without  significant regions of traction bronchiectasis or frank honeycombing.  Mild basilar predominance to these findings.  No convincing interval progression since previous visit chest CT. 10/09/2020 PFTs: FVC 2 (90), FEV1 1.7 (104), FEV1/FVC 85, DLCO uncorrected 14.82 (87)   10/09/2020: OV with Dr. 12/09/2020.  Symptom score slightly worse but related to worsening back pain.  Reported shortness of breath was stable.  PFTs were stable. HRCT stable with only mild ILD.  Does not meet indications for antifibrotic therapies.  Plan for follow-up in 9 months with walk test and symptom score.  Deferred pulmonary rehab due to back pain issues.  01/29/2021: Today - acute sick visit Patient presents today with reports of dry cough, hoarsenss, and chest congestion since Sunday. Her symptoms have persisted since without worsening. She has experienced minimal episodes of shortness of breath with exertion that resolve quickly with rest. Her cough has been non-productive; although, she feels as though she needs to "get stuff up". She has had some nasal congestion in the mornings along with clear rhinorrhea, this subsides later in the day. She denies fever, chills, orthopnea, PND, or leg swelling. She has not had any headaches, body aches, or sore throat. She has not attempted any over the counter remedies for symptom relief.    Allergies  Allergen Reactions   Rosuvastatin Other (See Comments)    Other reaction(s): Blister on lips, body aches, HA    Immunization History  Administered Date(s) Administered   Fluad Quad(high Dose 65+) 11/13/2019   Influenza, Quadrivalent, Recombinant, Inj, Pf 11/10/2019   Janssen (J&J) SARS-COV-2 Vaccination 05/12/2019   Moderna Sars-Covid-2 Vaccination 05/24/2019, 12/24/2019   Pneumococcal Conjugate-13 11/13/2019   Pneumococcal-Unspecified 11/10/2019    Past Medical History:  Diagnosis Date   Arthritis  Cervical pain    Hyperglycemia     Tobacco History: Social History    Tobacco Use  Smoking Status Never  Smokeless Tobacco Never   Counseling given: Not Answered   Outpatient Medications Prior to Visit  Medication Sig Dispense Refill   ACCU-CHEK AVIVA PLUS test strip      Accu-Chek FastClix Lancets MISC      Cholecalciferol 25 MCG (1000 UT) capsule TK 1 C PO QD     Estrogens Conjugated (PREMARIN PO) Take by mouth.     EYSUVIS 0.25 % SUSP      furosemide (LASIX) 20 MG tablet Take 20 mg by mouth daily.     GLIPIZIDE PO Take by mouth. Only occasionally     losartan (COZAAR) 25 MG tablet      meclizine (ANTIVERT) 12.5 MG tablet Take 1 tablet (12.5 mg total) by mouth 3 (three) times daily as needed for dizziness. 30 tablet 0   Vitamin D, Ergocalciferol, (DRISDOL) 1.25 MG (50000 UNIT) CAPS capsule      No facility-administered medications prior to visit.     Review of Systems:   Constitutional: No weight loss or gain, night sweats, fevers, chills, fatigue, or lassitude. HEENT: +nasal congestion, rhinorrhea. No headaches, difficulty swallowing, tooth/dental problems, or sore throat. No sneezing, itching, ear ache CV:  No chest pain, orthopnea, PND, swelling in lower extremities, anasarca, dizziness, palpitations, syncope Resp: + minimal shortness of breath with exertion. +nonproductive cough, chest congestion. No excess mucus or change in color of mucus.  No hemoptysis. No wheezing.  No chest wall deformity GI:  No heartburn, indigestion, abdominal pain, nausea, vomiting, diarrhea, change in bowel habits, loss of appetite, bloody stools.  GU: No dysuria, change in color of urine, urgency or frequency.  No flank pain, no hematuria  Skin: No rash, lesions, ulcerations MSK:  No joint pain or swelling.  No decreased range of motion.  +Chronic back pain (followed by nsg). Neuro: No dizziness or lightheadedness.  Psych: No depression or anxiety. Mood stable.     Physical Exam:  BP 120/60 (BP Location: Left Arm, Patient Position: Sitting, Cuff Size:  Normal)   Pulse 76   Temp 98.3 F (36.8 C) (Oral)   Ht 5\' 2"  (1.575 m)   Wt 151 lb 12.8 oz (68.9 kg)   SpO2 97%   BMI 27.76 kg/m   GEN: Pleasant, interactive, well-nourished; in no acute distress. HEENT:  Normocephalic and atraumatic. EACs patent bilaterally. TM pearly gray with present light reflex bilaterally. PERRLA. Sclera white. Nasal turbinates pink, moist and patent bilaterally. Clear rhinorrhea. Oropharynx erythematous and moist, without exudate or edema. No lesions, ulcerations. NECK:  Supple w/ fair ROM. No JVD present. Normal carotid impulses w/o bruits. Thyroid symmetrical with no goiter or nodules palpated. Right cervical lymphadenopathy present.   CV: RRR, no m/r/g, no peripheral edema. Pulses intact, +2 bilaterally. No cyanosis, pallor or clubbing. PULMONARY:  Unlabored, regular breathing. Rhonchorous breath sounds scattered A&P w/o wheezes. No accessory muscle use. No dullness to percussion. GI: BS present and normoactive. Soft, non-tender to palpation. No organomegaly or masses detected. No CVA tenderness. MSK: No erythema, warmth or tenderness. Cap refil <2 sec all extrem. No deformities or joint swelling noted.  Neuro: A/Ox3. No focal deficits noted.   Skin: Warm, no lesions or rashe Psych: Normal affect and behavior. Judgement and thought content appropriate.     Lab Results:  CBC    Component Value Date/Time   HGB 15.3 (H) 10/09/2017 1724   HCT  45.0 10/09/2017 1724    BMET    Component Value Date/Time   NA 134 (L) 10/09/2017 1724   K 3.8 10/09/2017 1724   CL 102 10/09/2017 1724   GLUCOSE 174 (H) 10/09/2017 1724   BUN 14 10/09/2017 1724   CREATININE 0.50 10/09/2017 1724    BNP No results found for: BNP   Imaging:  01/29/21 CXR 2 View: Reviewed by me. Chronic bronchitic type changes without infiltrates, edema, or effusions. No acute cardiopulmonary findings.   DG Chest 2 View  Result Date: 01/29/2021 CLINICAL DATA:  Cough, congestion and  intermittent shortness of breath. EXAM: CHEST - 2 VIEW COMPARISON:  Chest CT 09/25/2020 FINDINGS: The heart is normal in size. The mediastinal and hilar contours are within normal limits. Mild chronic bronchitic type changes but no infiltrates, edema or effusions. The bony thorax is intact. IMPRESSION: No acute cardiopulmonary findings. Electronically Signed   By: Marijo Sanes M.D.   On: 01/29/2021 15:46      PFT Results Latest Ref Rng & Units 10/09/2020 11/27/2019  FVC-Pre L 2.00 1.95  FVC-Predicted Pre % 90 86  FVC-Post L - 2.08  FVC-Predicted Post % - 92  Pre FEV1/FVC % % 85 84  Post FEV1/FCV % % - 88  FEV1-Pre L 1.70 1.63  FEV1-Predicted Pre % 104 98  FEV1-Post L - 1.83  DLCO uncorrected ml/min/mmHg 14.82 12.41  DLCO UNC% % 87 73  DLCO corrected ml/min/mmHg 14.82 12.41  DLCO COR %Predicted % 87 73  DLVA Predicted % 105 77  TLC L - 3.82  TLC % Predicted % - 83  RV % Predicted % - 82    No results found for: NITRICOXIDE      Assessment & Plan:   Bronchitis Supportive care. Prednisone 40 mg for 5 days. Educated to monitor blood sugars three times a day.  No indication for abx therapy at this time. CXR today showed no acute cardiopulmonary findings. Flu A/B and COVID swabs negative today. Advised to obtain COVID PCR. Follow up sooner if symptoms do not improve or worsen.    Patient Instructions  -Albuterol inhaler 2 puffs every 6 hours as needed for shortness of breath or wheezing  -Albuterol nebulizer 3 mL twice a day until symptoms improve then as needed every six hours for shortness of breath or wheezing  -Mucinex DM over the counter twice a day  -Chlortab 4 mg over the counter At bedtime for cough  -Saline nasal spray 2-3 times a day -Flonase 1-2 sprays each nostril daily until symptoms improve   Prednisone 40 mg for 5 days. Take with food. Monitor your blood sugars three times a day while on the prednisone and notify if consistently above 180. You can return to checking  them twice a day once you finish.   Chest x ray today. We will notify you of your results.   Rapid flu A/B and COVID today.   Follow up in 3 months with Dr. Chase Caller. If symptoms do not improve or worsen, please contact office for sooner follow up with APP or seek emergency care.    ILD (interstitial lung disease) ([place]) Stable pulmonary symptoms prior to this. No current maintenance regimen in place. See above plan.   Acute rhinosinusitis Minimal symptoms with clear drainage. Supportive care initiated. See above plan.      Clayton Bibles, NP 01/29/2021  Pt aware and understands NP's role.

## 2021-01-29 NOTE — Telephone Encounter (Signed)
Spoke with pt who states she has been losing her voice and had chest tightness since Saturday. Pt denies fever/chills/ GI upset/ cough/ wheezing. Pt was scheduled with Micheline Maze at 3 pm today. Pt instructed to arrive at 2:45 pm. Pt stated understanding. Nothing further needed at this time.   Routing to [employer] as [employer]

## 2021-02-09 ENCOUNTER — Other Ambulatory Visit: Payer: Self-pay

## 2021-02-09 ENCOUNTER — Ambulatory Visit
Admission: RE | Admit: 2021-02-09 | Discharge: 2021-02-09 | Disposition: A | Discharge status: Home / Self Care | Payer: Medicare PPO | Source: Ambulatory Visit | Attending: Internal Medicine | Admitting: Internal Medicine

## 2021-02-09 DIAGNOSIS — K862 Cyst of pancreas: Secondary | ICD-10-CM

## 2021-02-09 IMAGING — MR MR ABDOMEN WO/W CM
11 of 18 series · 26 of 48 positions shown · IV contrast (multihance)
Comparison: Abdominopelvic CT [DATE]. Abdominal MRI [DATE].

CLINICAL DATA: Follow-up pancreatic cysts.

EXAM:
MRI ABDOMEN WITHOUT AND WITH CONTRAST
TECHNIQUE: Multiplanar multisequence MR imaging of the abdomen was performed
both before and after the administration of intravenous contrast.
CONTRAST:  14mL MULTIHANCE GADOBENATE DIMEGLUMINE 529 MG/ML IV SOLN

[Series 2: cor haste · coronal · 5.0mm · 0.70mm/px · 2 of 35 slices shown]
[im 1/35]
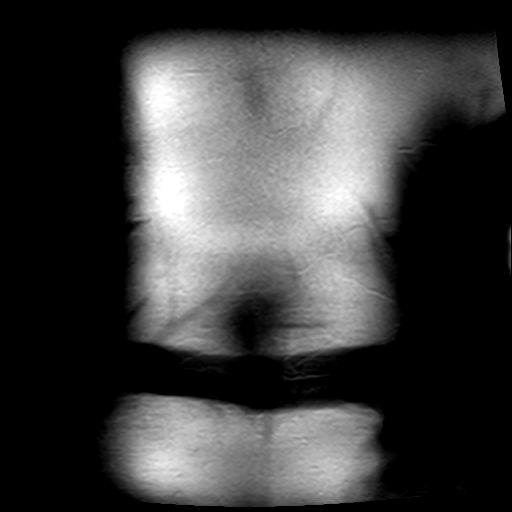
[im 35/35]
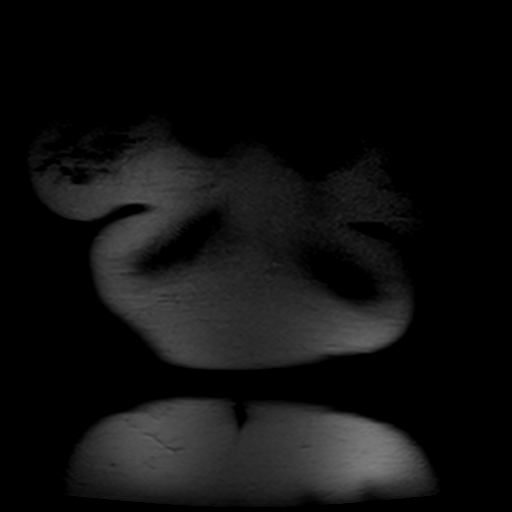

[Series 3: axial haste · axial · 6.0mm · 0.70mm/px · z∈[-19,+192]mm · 2 of 33 slices shown]
[im 1/33]
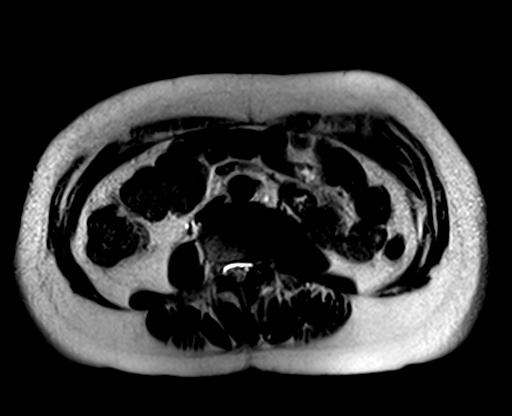
[im 33/33]
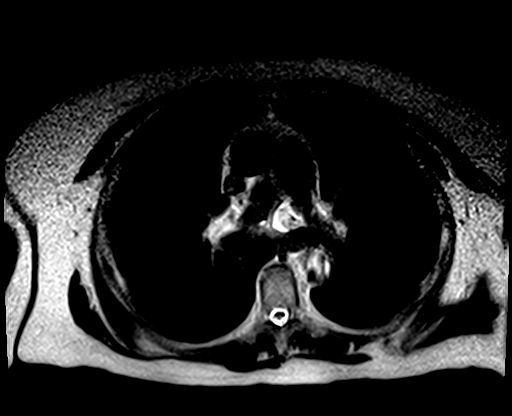

[Series 4: T1 · axial · 6.0mm · 0.70mm/px · z∈[-19,+192]mm · 3 of 66 slices shown]
[im 1/66]
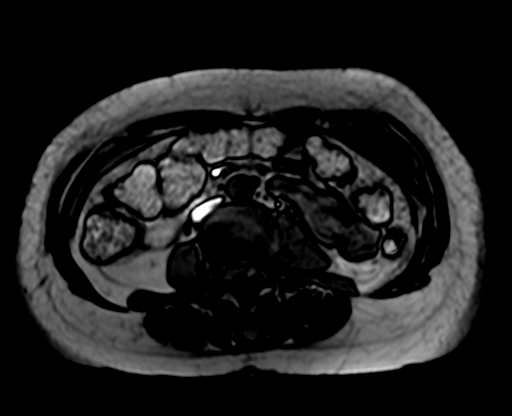
[im 33/66]
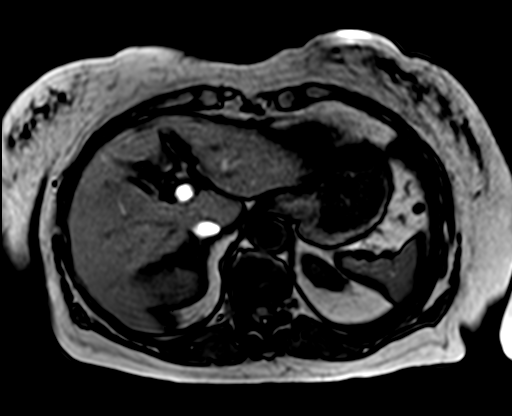
[im 66/66]
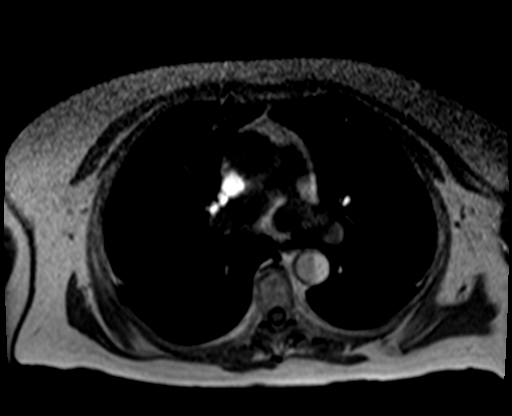

[Series 5: T2 · coronal · 3.0mm · 0.70mm/px · 2 of 48 slices shown (1 of 2)]
[im 1/48]
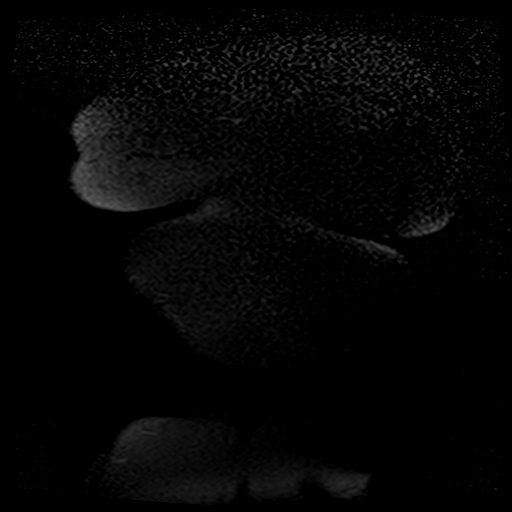
[im 48/48]
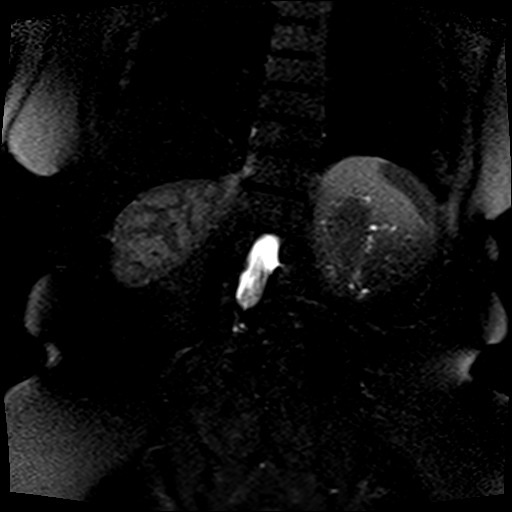

[Series 6: T2 · axial · 6.0mm · 1.12mm/px · 1 of 33 slices shown (2 of 2)]
[im 1/33]
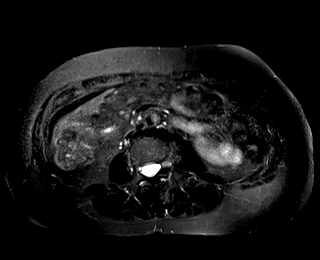

[Series 7: [id number]_trig · axial · 6.0mm · 1.88mm/px · z∈[-14,+198]mm · 4 of 99 slices shown]
[im 1/99]
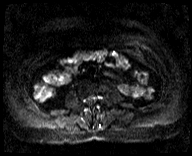
[im 33/99]
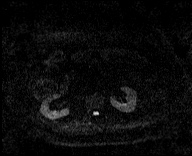
[im 66/99]
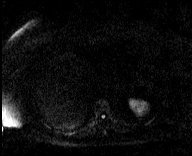
[im 99/99]
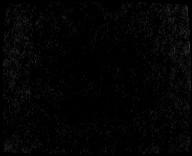

[Series 8: [id number]_trig_adc · axial · 6.0mm · 1.88mm/px · 1 of 32 slices shown]
[im 1/32]
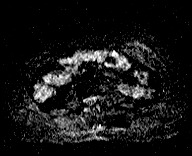

[Series 13: T1 dynamic · axial · non-contrast · 2.5mm · 0.70mm/px · z∈[-17,+201]mm · 3 of 88 slices shown]
[im 1/88]
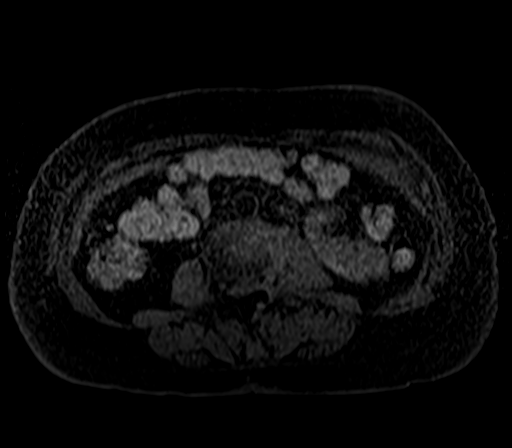
[im 44/88]
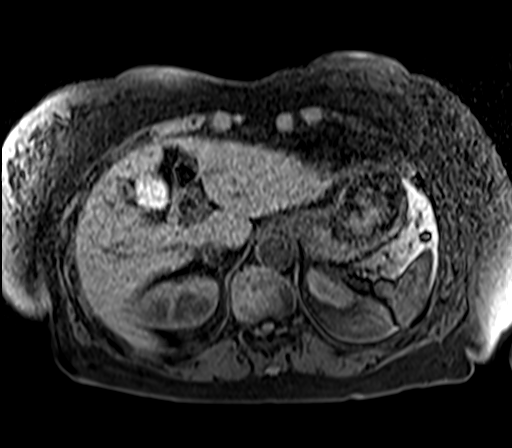
[im 88/88]
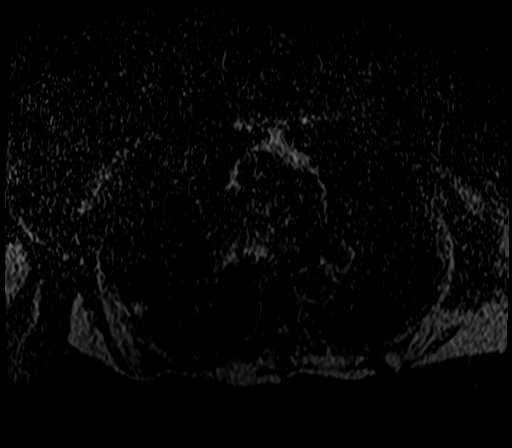

[Series 14: T1 dynamic post-contrast · axial · 2.5mm · 0.70mm/px · z∈[-17,+201]mm · 3 of 88 slices shown (1 of 3)]
[im 1/88]
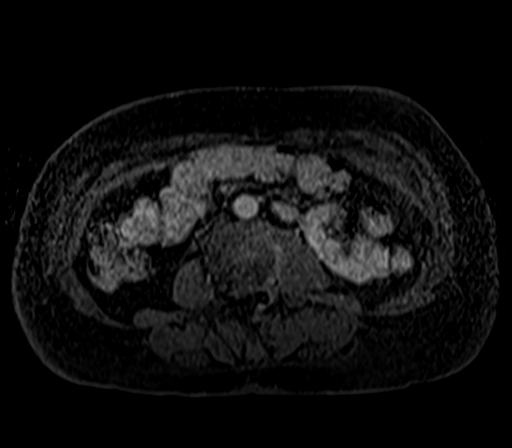
[im 44/88]
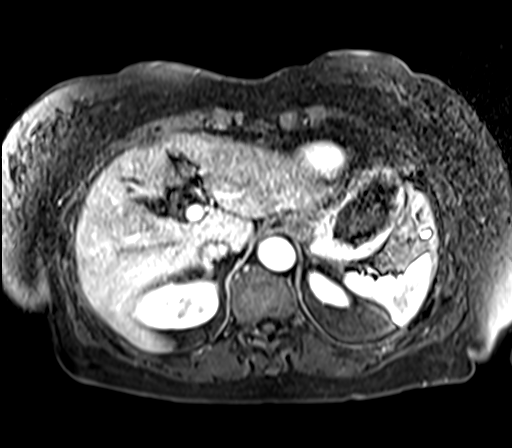
[im 88/88]
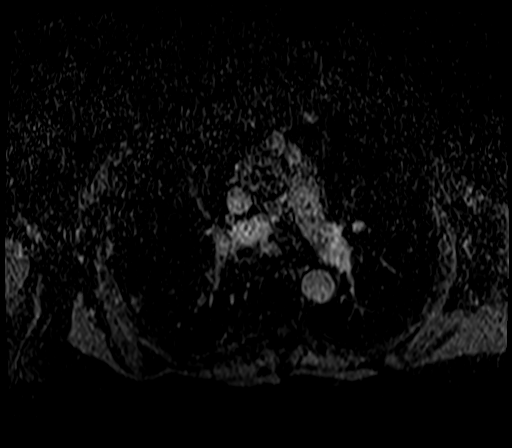

[Series 15: T1 dynamic post-contrast · axial · 2.5mm · 0.70mm/px · z∈[-17,+201]mm · 3 of 88 slices shown (2 of 3)]
[im 1/88]
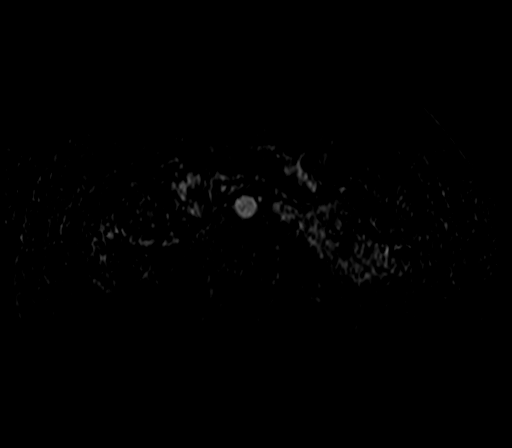
[im 44/88]
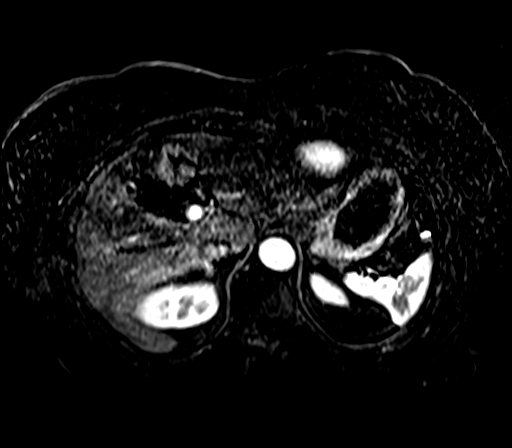
[im 88/88]
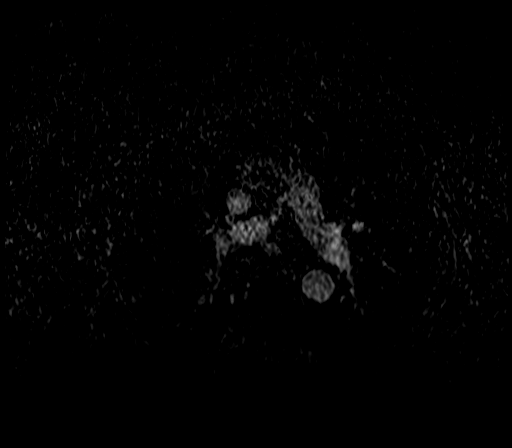

[Series 16: T1 dynamic post-contrast · axial · 2.5mm · 0.70mm/px · z∈[-17,+91]mm · 2 of 88 slices shown (3 of 3)]
[im 1/88]
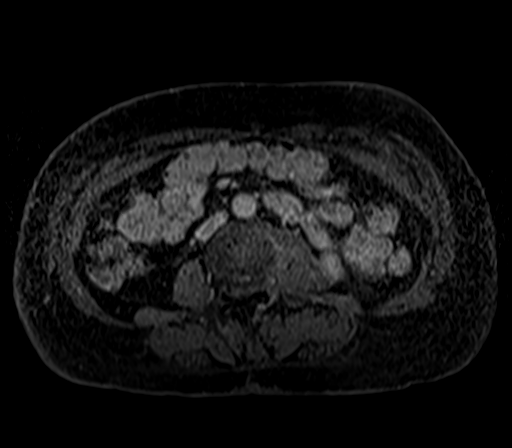
[im 44/88]
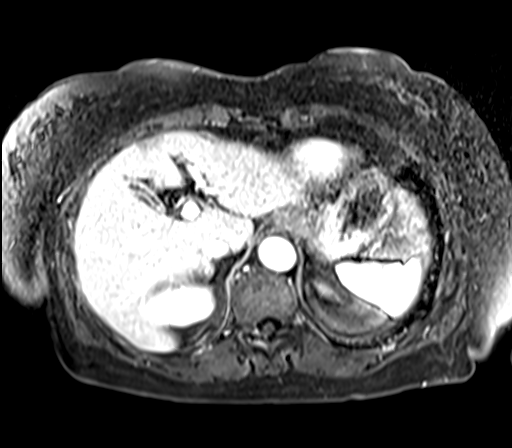

[26 of 48 positions shown; findings below may reference images not displayed]

FINDINGS: Lower chest:  The visualized lower chest appears unremarkable.

Hepatobiliary: Stable focal fat anteriorly in the left hepatic lobe.
No suspicious liver lesion or abnormal enhancement. The gallbladder
is incompletely distended. No evidence of gallstones, gallbladder
wall thickening or biliary dilatation.

Pancreas: Numerous cystic pancreatic lesions are again noted,
including a 1.3 cm lesion projecting anteriorly from the pancreatic
body on image [DATE]. The previously demonstrated lesion within the
pancreatic head has decreased in size. No new or enlarging lesions
are identified. As before, some of the smaller lesions may
communicate with the main pancreatic duct which is not dilated.
Following contrast, no abnormal enhancement or other aggressive
characteristics identified.

Spleen: Normal in size without focal abnormality.

Adrenals/Urinary Tract: Both adrenal glands appear normal. The
kidneys and ureters appear unremarkable, without hydronephrosis or
focally suspicious lesion.

Stomach/Bowel: The stomach appears unremarkable for its degree of
distension. No evidence of bowel wall thickening, distention or
surrounding inflammatory change.Mildly prominent stool throughout
the colon.

Vascular/Lymphatic: There are no enlarged abdominal lymph nodes.
Aortic and branch vessel atherosclerosis. A calcified splenic artery
aneurysm measuring approximately 14 mm appears thrombosed and
unchanged, better seen on CT.

Other: No evidence of abdominal wall hernia or ascites.

Musculoskeletal: Convex right thoracolumbar scoliosis with
associated spondylosis. No acute or worrisome osseous findings.
IMPRESSION: 1. The previously demonstrated pancreatic cysts are similar in size
to the previous study from [DATE], and no enlarging or
aggressive lesions are identified. These features favor a benign
etiology. Given the patient's age, consider follow-up MRI in 2 years
per consensus guidelines. This recommendation follows ACR consensus
guidelines: Management of Incidental Pancreatic Cysts: A White Paper
of the ACR Incidental Findings Committee. [HOSPITAL]
2. No acute or concerning findings.

## 2021-02-09 MED ORDER — GADOBENATE DIMEGLUMINE 529 MG/ML IV SOLN
14.0000 mL | Freq: Once | INTRAVENOUS | Status: AC | PRN
  Administered 2021-02-09: 14 mL via INTRAVENOUS

## 2021-03-05 ENCOUNTER — Telehealth: Payer: Self-pay | Admitting: Nurse Practitioner

## 2021-03-05 NOTE — Telephone Encounter (Signed)
Called patient but she did not answer. Left message for her to call us back.  

## 2021-04-12 ENCOUNTER — Other Ambulatory Visit: Payer: Self-pay | Admitting: Nurse Practitioner

## 2021-04-12 DIAGNOSIS — R0609 Other forms of dyspnea: Secondary | ICD-10-CM

## 2021-04-12 DIAGNOSIS — J40 Bronchitis, not specified as acute or chronic: Secondary | ICD-10-CM

## 2021-04-22 NOTE — Telephone Encounter (Signed)
Called and spoke to pt. Pt states her cough has resolved but needs a routine follow up visit. Offered pt an appt this month but pt preferred a March appt. Appt scheduled for 3/30. Pt verbalized understanding and denied any further questions or concerns at this time.

## 2021-05-30 ENCOUNTER — Ambulatory Visit: Payer: Medicare PPO | Admitting: Internal Medicine

## 2021-06-05 ENCOUNTER — Encounter: Payer: Self-pay | Admitting: Internal Medicine

## 2021-06-05 ENCOUNTER — Ambulatory Visit (INDEPENDENT_AMBULATORY_CARE_PROVIDER_SITE_OTHER): Payer: Medicare PPO | Admitting: Internal Medicine

## 2021-06-05 VITALS — BP 126/64 | HR 82 | Temp 98.2°F | Ht 62.0 in | Wt 147.2 lb

## 2021-06-05 DIAGNOSIS — J849 Interstitial pulmonary disease, unspecified: Secondary | ICD-10-CM

## 2021-06-05 NOTE — Patient Instructions (Addendum)
ILD (interstitial lung disease) ([facility] [place]) ? ?-Clinically undifferentiated, mild and stable  ? ?Plan ?- Do HRCT supine and prone in 6 months  ?- do spiro/dlco in 34months ? ?Followup ?Follow-up ?- Return in 6 months - to discuss ct and pft results ?

## 2021-06-05 NOTE — Progress Notes (Signed)
? ? ? ? ?OV 10/18/2019 ? ?Subjective:  ?[name], female , DOB: 17-Oct-1940 , age 81 y.o. , MRN: [id number] , ADDRESS: [address] ?[place] [address] ? ? ?10/18/2019 -   ?Chief Complaint  ?Patient presents with  ? Consult  ?  reports feeling at baseline today. reports cough stopped after completing recent course of abx.  ? ? ? ?HPI ?Adriana Donovan 81 y.o. - [place].    Then around 22 years ago moved to [facility] and subsequently to [facility].  She is here because of an abnormal CT chest.  She states some 30 years ago while in [place] area she had a cough and then a chest x-ray showed something.  She was then admitted to the hospital had a bronchoscopy and apparently and the biopsy showed sarcoidosis.  She took steroids for 6 months and then after that the pulmonologist told her she was "done".  Then she was doing fine without any respiratory issues.  Then approximately 15 years ago went to [place] to see her mom and shortly after that just before her return she got sick with respiratory issues.  When she arrived in the room she was admitted to the hospital for 7 days.  She does not know any details.  She says the doctor never give any details.  She does not remember being on oxygen.  She was discharged on steroids which she took for "short while".  She was told that the steroids clouded her diagnosis.  No specific etiology was found but then she got better after the steroids course was done.  And she been doing fine ever since.  Then approximately 6-week ago she had some benefit surgery to her left side.  This was around the hip to remove some scar.  Some week or 2 after that for the last 4 to 6 weeks she has had cough and shortness of breath.  Shortness of breath is present on exertion.  There is no wheezing or proximal nocturnal dyspnea or orthopnea.  She is now on prednisone for the last 10 days.  With this the cough is resolved but the still  shortness of breath on exertion relieved by rest.  She has no past history of pulmonary embolism or cancer connective tissue disease.  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  In my personal visualization this was also present in the lung images of an abdominal CT from September 2020.  There are some elevation of the right hemidiaphragm.  She is noted to be on losartan. ? ?Of note she is quite active and even does some gardening work. ? ? ?SYMPTOM SCALE - ILD 10/18/2019 ?  ?O2 use ra  ?Shortness of Breath 0 -> 5 scale with 5 being worst (score 6 If unable to do)  ?At rest 0  ?Simple tasks - showers, clothes change, eating, shaving 1  ?Household (dishes, doing bed, laundry) 1  ?Shopping 1  ?Walking level at own pace 2  ?Walking up Stairs *3  ?Total (30-36) Dyspnea Score 8  ?How bad is your cough? 0  ?How bad is your fatigue 2  ?How bad is nausea 0  ?How bad is vomiting? ? 0  ?How bad is diarrhea? 0  ?How bad is anxiety? 0  ?How bad is depression 0  ? ? ? ? ? ?Simple office walk 185 feet x  3 laps goal with forehead probe  10/18/2019 ?  ?O2 used ra  ?Number laps completed 3  ?Comments about pace moder pace - briks  ?Resting Pulse Ox/HR 96% and 89/min  ?Final Pulse Ox/HR 96% and 98/min  ?Desaturated </= 88% no  ?Desaturated <= 3% points no  ?Got Tachycardic >/= 90/min yes  ?Symptoms at end of test No dyspnea, just tired  ?Miscellaneous comments x  ? ? ? ?11/27/2019  - Visit  ? ?81 year old female never smoker followed in our office for interstitial lung disease. She was last seen for initial consult in August/2021 by Dr. Chase Caller. Plan of care from that office visit was as follows: ? ?Fill out ILD questionnaire, stop prednisone today, do full pulmonary function testing, obtain autoimmune panel, follow-up in 4 to 8 weeks with Dr. Chase Caller. ? ?Patient's connective tissue lab work was essentially negative. Mildly elevated ANA. Titer of 1: 80, reviewed by Dr. Chase Caller  and felt to be of no clinical significance. Patient presenting today after completing pulmonary function testing. Those results are listed below: ? ?11/27/2019-pulmonary function test-FVC 1.95 (86% predicted), postbronchodilator ratio 88, postbronchodilator FEV1 1.83 (109% predicted), no bronchodilator response, TLC 3.82 (83% predicted), DLCO 12.41 (73% predicted), lower level of normal DLCO 14.15 ? ?Patient continues to report left abdominal side pain as well as left lower extremity leg pain numbness and tingling.  She plans to follow-up with primary care tomorrow.  She reports the numbness and tingling is worse at night.  She does have type 2 diabetes.  She reports that her blood sugars have been controlled but they have been persistently elevated in the past due to recurrent prednisone use. ? ?She was recently prescribed Lasix by primary care but she did not notice much improvement.   ? ?She has an appointment with primary care tomorrow Dr. Heide Guile to discuss this.  Patient is up-to-date with COVID-19 vaccinations as well as flu vaccine. ? ?Patient reports that she is filled out the ILD questionnaire, and she dropped this back off at our office. ? ?Walked in office today was able to complete 2 laps with oxygen levels dropping to 88% on room air, patient did not require oxygen ? ?Tests:  ? ?10/18/2019-connective tissue labs-positive ANA, ANA titer 1: 80, nuclear, speckled ?Rest of connective tissue labs negative ? 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 ?11/27/2019-pulmonary function test-FVC 1.95 (86% predicted), postbronchodilator ratio 88, postbronchodilator FEV1 1.83 (109% predicted), no bronchodilator response, TLC 3.82 (83% predicted), DLCO 12.41 (73% predicted), lower level of normal DLCO 14.15 ? ? ?ROS ?- per HPI ? ?OV 06/20/2020 ? ?Subjective:  ?[name], female , DOB: 06/10/1940 , age 31 y.o. , MRN: [id number] , AD[address]ESS: [address] ?[place] [address] ?PCP Michael Boston, MD ?Patient Care Team: ?Michael Boston, MD as PCP - General (Internal Medicine) ?Buford Dresser, MD as PCP - Cardiology (Cardiology) ? ?This Provider for this visit: Treatment Team:  ?Attending Provider: Brand Males, MD ? ? ? ?06/20/2020 -   ?Chief Complaint  ?Patient presents with  ? Follow-up  ?  Reports shortness of breath with activity. Also concerned about back pain, recently diagnosed with  scoliosis.   ? ?Fu ILD NOS ? ?HPI ?Wendie Ordoyne 81 y.o. -returns for followup. Since I last saw her has seen PCP Jacalyn Lefevre, Jesse Sans, MD and APP in our office. Her breathing is stable. No cough. However, last 6 weeks has had back pain with some sciatica . Persistent. Severe pain esp with movement and twisting. Says has had MRI and shows lower back scolipsis. Has failed steroid shorts. Going to get 2nd opinion. Pain making dyspnea worse buyt overall feels ILD stable ? ?Could not walk for walk test ?At home has difficulty wit ADL and changing clothes dye to back pain ? ? ? ? ?ECHO Nov 2021 ? ?Echocardiogram 01/11/2020:  ?Normal LV systolic function with EF 61%. Left ventricle cavity is normal  ?in size. Mild concentric hypertrophy of the left ventricle. Normal global  ?wall motion.  ?Left atrial cavity is mildly dilated.  ?Trileaflet aortic valve.  Moderate (Grade III) aortic regurgitation.  ?Moderate (Grade II), posteriorly directed mitral regurgitation.  ?Mild tricuspid regurgitation.  ?No evidence of pulmonary hypertension ? ?  has a past medical history of Arthritis, Cervical pain, and Hyperglycemia. ? ? reports that she has never smoked. She has never used smokeless tobacco. ? ? ?OV 10/09/2020 ? ?Subjective:  ?Patient ID: Berta Minor

## 2021-08-28 ENCOUNTER — Ambulatory Visit (INDEPENDENT_AMBULATORY_CARE_PROVIDER_SITE_OTHER)
Admission: RE | Admit: 2021-08-28 | Discharge: 2021-08-28 | Disposition: A | Discharge status: Home / Self Care | Payer: Medicare PPO | Source: Ambulatory Visit | Attending: Nurse Practitioner | Admitting: Nurse Practitioner

## 2021-08-28 ENCOUNTER — Ambulatory Visit (INDEPENDENT_AMBULATORY_CARE_PROVIDER_SITE_OTHER): Payer: Medicare PPO | Admitting: Nurse Practitioner

## 2021-08-28 ENCOUNTER — Encounter: Payer: Self-pay | Admitting: Nurse Practitioner

## 2021-08-28 VITALS — BP 126/64 | HR 79 | Temp 98.0°F | Ht 60.0 in | Wt 151.0 lb

## 2021-08-28 DIAGNOSIS — M546 Pain in thoracic spine: Secondary | ICD-10-CM | POA: Insufficient documentation

## 2021-08-28 DIAGNOSIS — J849 Interstitial pulmonary disease, unspecified: Secondary | ICD-10-CM | POA: Diagnosis not present

## 2021-08-28 MED ORDER — LIDOCAINE 5 % EX PTCH: 1.0000 | MEDICATED_PATCH | CUTANEOUS | 0 refills | Status: DC

## 2021-08-28 NOTE — Patient Instructions (Addendum)
Continue Albuterol inhaler 2 puffs or 3 mL neb every 6 hours as needed for shortness of breath or wheezing. Notify if symptoms persist despite rescue inhaler/neb use.   Lidocaine Patches - Place 1 patch onto the affected area daily. Remove & Discard patch within 12 hours or as directed by MD. Follow up with your primary care doctor if symptoms do not improve.  Chest x ray today  Pulmonary function testing in October  High resolution CT scan in October - someone will contact you for scheduling  Follow up with Dr. Marchelle Gearing after PFTs and HRCT. If symptoms do not improve or worsen, please contact office for sooner follow up or seek emergency care.

## 2021-08-28 NOTE — Assessment & Plan Note (Signed)
Intermittent right sided thoracic pain primarily with standing. She has chronic back pain from scoliosis and takes tylenol daily for this. She has had similar pain before, which she has been treated with steroids for, but she wanted to make sure that this was not related to her lungs. We will check CXR to rule out acute process but respiratory exam was unremarkable. Sent lidocaine patches for her to try. Recommended she follow up with her PCP or ortho if pain does not improve and our workup is unrevealing.   Patient Instructions  Continue Albuterol inhaler 2 puffs or 3 mL neb every 6 hours as needed for shortness of breath or wheezing. Notify if symptoms persist despite rescue inhaler/neb use.   Lidocaine Patches - Place 1 patch onto the affected area daily. Remove & Discard patch within 12 hours or as directed by MD. Follow up with your primary care doctor if symptoms do not improve.  Chest x ray today  Pulmonary function testing in October  High resolution CT scan in October - someone will contact you for scheduling  Follow up with Dr. Marchelle Gearing after PFTs and HRCT. If symptoms do not improve or worsen, please contact office for sooner follow up or seek emergency care.

## 2021-08-28 NOTE — Progress Notes (Signed)
Please notify patient CXR was clear. I do not see anything to correlate to her right sided back pain. Advise her to try the lidocaine patches and if no relief, contact her PCP. If she develops respiratory symptoms, please call us for sooner follow up. Thanks!

## 2021-08-28 NOTE — Progress Notes (Signed)
@Patient  ID: , female    DOB: 1940-11-28, 81 y.o.   MRN: [id number]  Chief Complaint  Patient presents with   Follow-up    Patient states that she is having pain in her lung. The pain has been there for a couple weeks.     Referring provider: [id number], MD  HPI: 81 year old female, never smoker followed for interstitial lung disease. She is a patient of Dr. 94 and was last seen in office 06/05/2021.  Past medical history significant for aortic valve regurgitation, mitral valve insufficiency, arthritis, lumbar stenosis, HTN, DM II. She is followed by cardiology.   TEST/EVENTS:  09/25/2019 CT chest without contrast: Mild bibasilar predominant irregular peripheral interstitial and groundglass opacity, generally nonspecific and not characteristic in appearance of fibrotic pulmonary sarcoidosis.  Indeterminate for UIP pattern.  Consider follow-up of ILD protocol CT chest in 1 year to observe stability of fibrotic findings.  Occasional scattered calcified and benign small pulmonary nodules.  Elevation of right hemidiaphragm.  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 10/18/2019 connective tissue labs: Positive ANA, ANA titer 1:80, nuclear, speckled.  Other connective tissue labs negative. 11/27/2019 PFTs: FVC 1.95 (86), post BD ratio 88, post BD FEV one 1.83 (109%), TLC 3.82 (83%), DLCO 12.41 (73).  No BD 09/25/2020 HRCT: Atherosclerosis nonaneurysmal thoracic aorta.  No lymphadenopathy.  Coarsely calcified granulomatosis left hilar nodes.  No acute consolidative airspace disease, lung masses or significant pulmonary nodules.  Stable calcified subcentimeter posterior right lower lobe granuloma.  No significant lobular air trapping or evidence of tracheobronchial malacia on the expiration sequence.  Mild patchy confluent subpleural reticulation and groundglass opacity throughout both lungs without significant regions of  traction bronchiectasis or frank honeycombing.  Mild basilar predominance to these findings.  No convincing interval progression since previous visit chest CT. 10/09/2020 PFTs: FVC 2 (90), FEV1 1.7 (104), FEV1/FVC 85, DLCO uncorrected 14.82 (87)  06/05/2021: OV with Dr. 08/05/2021. Doing well. She is able to walk 1 mile every day without difficulties. Symptom score stable. Clinically undifferentiated, mild and stable. Do HRCT and spiro/DLCO in 6 months.   08/28/2021: Today - acute Patient presents today for pain in her right mid back. She was worried it may be her lung. Her breathing is stable. The pain sometimes does take her breath away for a minute but once the pain resolves, this goes away. The pain only occurs upon standing/certain position changes. She describes it has a sharp, stabbing pain. She does have a history of chronic back pain related to scoliosis so is concerned it could be related to this, but wanted to make sure it was not her lungs first. She denies any cough, wheezing, hemoptysis, calf pain/swelling. She denies recent trauma or known injury.  Allergies  Allergen Reactions   Rosuvastatin Other (See Comments)    Other reaction(s): Blister on lips, body aches, HA    Immunization History  Administered Date(s) Administered   Fluad Quad(high Dose 65+) 11/13/2019   Influenza, Quadrivalent, Recombinant, Inj, Pf 11/10/2019   Janssen (J&J) SARS-COV-2 Vaccination 05/12/2019   Moderna Sars-Covid-2 Vaccination 05/24/2019, 12/24/2019   Pneumococcal Conjugate-13 11/13/2019   Pneumococcal-Unspecified 11/10/2019    Past Medical History:  Diagnosis Date   Arthritis    Cervical pain    Hyperglycemia     Tobacco History: Social History   Tobacco Use  Smoking Status Never  Smokeless Tobacco Never   Counseling given: Not Answered    Outpatient  Medications Prior to Visit  Medication Sig Dispense Refill   ACCU-CHEK AVIVA PLUS test strip      Accu-Chek FastClix Lancets MISC       Cholecalciferol 25 MCG (1000 UT) capsule TK 1 C PO QD     Estrogens Conjugated (PREMARIN PO) Take by mouth.     EYSUVIS 0.25 % SUSP      furosemide (LASIX) 20 MG tablet Take 20 mg by mouth daily.     GLIPIZIDE PO Take by mouth. Only occasionally     losartan (COZAAR) 25 MG tablet      meclizine (ANTIVERT) 12.5 MG tablet Take 1 tablet (12.5 mg total) by mouth 3 (three) times daily as needed for dizziness. 30 tablet 0   Vitamin D, Ergocalciferol, (DRISDOL) 1.25 MG (50000 UNIT) CAPS capsule      albuterol (PROVENTIL) (2.5 MG/3ML) 0.083% nebulizer solution Take 3 mLs (2.5 mg total) by nebulization every 6 (six) hours as needed for wheezing or shortness of breath. (Patient not taking: Reported on 06/05/2021) 75 mL 12   albuterol (VENTOLIN HFA) 108 (90 Base) MCG/ACT inhaler INHALE 2 PUFFS INTO THE LUNGS EVERY 6 HOURS AS NEEDED FOR WHEEZING OR SHORTNESS OF BREATH (Patient not taking: Reported on 06/05/2021) 18 g 3   No facility-administered medications prior to visit.     Review of Systems:   Constitutional: No weight loss or gain, night sweats, fevers, chills, fatigue, or lassitude. HEENT: No headaches, difficulty swallowing, tooth/dental problems, or sore throat. No sneezing, itching, ear ache, nasal congestion, rhinorrhea. CV:  No chest pain, orthopnea, PND, swelling in lower extremities, anasarca, dizziness, palpitations, syncope Resp: +breathlessness with pain episodes. No cough. No excess mucus or change in color of mucus.  No hemoptysis. No wheezing.  No chest wall deformity GI:  No heartburn, indigestion, abdominal pain, nausea, vomiting, diarrhea, change in bowel habits, loss of appetite, bloody stools.  GU: No dysuria, change in color of urine, urgency or frequency.  No flank pain, no hematuria  Skin: No rash, lesions, ulcerations MSK:  No joint pain or swelling.  No decreased range of motion.  +Chronic back pain (followed by nsg); sharp right sided thoracic pain with standing Neuro: No  dizziness or lightheadedness.  Psych: No depression or anxiety. Mood stable.     Physical Exam:  BP 126/64 (BP Location: Right Arm, Patient Position: Sitting, Cuff Size: Normal)   Pulse 79   Temp 98 F (36.7 C) (Oral)   Ht 5' (1.524 m)   Wt 151 lb (68.5 kg)   SpO2 97%   BMI 29.49 kg/m   GEN: Pleasant, interactive, well-nourished; in no acute distress. HEENT:  Normocephalic and atraumatic. EACs patent bilaterally. TM pearly gray with present light reflex bilaterally. PERRLA. Sclera white. Nasal turbinates pink, moist and patent bilaterally. No rhinorrhea. Oropharynx pink and moist, without exudate or edema. No lesions, ulcerations. NECK:  Supple w/ fair ROM. No JVD present.  CV: RRR, no m/r/g, no peripheral edema. Pulses intact, +2 bilaterally. No cyanosis, pallor or clubbing. PULMONARY:  Unlabored, regular breathing. Clear A&P w/o wheezes/rales/rhonchi. No accessory muscle use. No dullness to percussion. GI: BS present and normoactive. Soft, non-tender to palpation. No organomegaly or masses detected. No CVA tenderness. MSK: No erythema, warmth or tenderness. Cap refil <2 sec all extrem. No deformities or joint swelling noted.  Neuro: A/Ox3. No focal deficits noted.   Skin: Warm, no lesions or rashe Psych: Normal affect and behavior. Judgement and thought content appropriate.     Lab Results:  CBC    Component Value Date/Time   HGB 15.3 (H) 10/09/2017 1724   HCT 45.0 10/09/2017 1724    BMET    Component Value Date/Time   NA 134 (L) 10/09/2017 1724   K 3.8 10/09/2017 1724   CL 102 10/09/2017 1724   GLUCOSE 174 (H) 10/09/2017 1724   BUN 14 10/09/2017 1724   CREATININE 0.50 10/09/2017 1724    BNP No results found for: "BNP"   No results found.       Latest Ref Rng & Units 10/09/2020    2:54 PM 11/27/2019   12:00 PM  PFT Results  FVC-Pre L 2.00  1.95   FVC-Predicted Pre % 90  86   FVC-Post L  2.08   FVC-Predicted Post %  92   Pre FEV1/FVC % % 85  84    Post FEV1/FCV % %  88   FEV1-Pre L 1.70  1.63   FEV1-Predicted Pre % 104  98   FEV1-Post L  1.83   DLCO uncorrected ml/min/mmHg 14.82  12.41   DLCO UNC% % 87  73   DLCO corrected ml/min/mmHg 14.82  12.41   DLCO COR %Predicted % 87  73   DLVA Predicted % 105  77   TLC L  3.82   TLC % Predicted %  83   RV % Predicted %  82     No results found for: "NITRICOXIDE"      Assessment & Plan:   Right-sided thoracic back pain Intermittent right sided thoracic pain primarily with standing. She has chronic back pain from scoliosis and takes tylenol daily for this. She has had similar pain before, which she has been treated with steroids for, but she wanted to make sure that this was not related to her lungs. We will check CXR to rule out acute process but respiratory exam was unremarkable. Sent lidocaine patches for her to try. Recommended she follow up with her PCP or ortho if pain does not improve and our workup is unrevealing.   Patient Instructions  Continue Albuterol inhaler 2 puffs or 3 mL neb every 6 hours as needed for shortness of breath or wheezing. Notify if symptoms persist despite rescue inhaler/neb use.   Lidocaine Patches - Place 1 patch onto the affected area daily. Remove & Discard patch within 12 hours or as directed by MD. Follow up with your primary care doctor if symptoms do not improve.  Chest x ray today  Pulmonary function testing in October  High resolution CT scan in October - someone will contact you for scheduling  Follow up with Adriana Donovan after PFTs and HRCT. If symptoms do not improve or worsen, please contact office for sooner follow up or seek emergency care.     ILD (interstitial lung disease) (HCC) She has been doing well since we saw her last. She is currently having breathlessness that occurs with episodes of pain but subsides once the pain is gone. No other respiratory symptoms; VS stable today. Wells low probability for PE. Suspect her pain is  flare of her chronic back pain. See above plan.  Plans for HRCT and repeat DLCO/spiro in October - ordered and scheduled today.    Adriana Chapel, Adriana Donovan 08/28/2021  Pt aware and understands Adriana Donovan's role.

## 2021-08-28 NOTE — Assessment & Plan Note (Signed)
She has been doing well since we saw her last. She is currently having breathlessness that occurs with episodes of pain but subsides once the pain is gone. No other respiratory symptoms; VS stable today. Wells low probability for PE. Suspect her pain is flare of her chronic back pain. See above plan.  Plans for HRCT and repeat DLCO/spiro in October - ordered and scheduled today.

## 2021-09-19 ENCOUNTER — Ambulatory Visit
Admission: RE | Admit: 2021-09-19 | Discharge: 2021-09-19 | Disposition: A | Discharge status: Home / Self Care | Payer: Medicare PPO | Source: Ambulatory Visit | Attending: Internal Medicine | Admitting: Internal Medicine

## 2021-09-19 ENCOUNTER — Other Ambulatory Visit: Payer: Self-pay | Admitting: Internal Medicine

## 2021-09-19 DIAGNOSIS — R109 Unspecified abdominal pain: Secondary | ICD-10-CM

## 2021-09-19 MED ORDER — IOPAMIDOL (ISOVUE-300) INJECTION 61%
100.0000 mL | Freq: Once | INTRAVENOUS | Status: AC | PRN
  Administered 2021-09-19: 100 mL via INTRAVENOUS

## 2021-11-26 ENCOUNTER — Ambulatory Visit
Admission: RE | Admit: 2021-11-26 | Discharge: 2021-11-26 | Disposition: A | Discharge status: Home / Self Care | Payer: Medicare PPO | Source: Ambulatory Visit | Attending: Nurse Practitioner | Admitting: Nurse Practitioner

## 2021-11-26 DIAGNOSIS — J849 Interstitial pulmonary disease, unspecified: Secondary | ICD-10-CM

## 2021-11-28 NOTE — Progress Notes (Signed)
Please notify patient CT chest was stable. Follow up with her PCP regarding the pancreatic lesion, which is stable but will need follow up in 01/2023. Thanks!

## 2021-12-04 ENCOUNTER — Other Ambulatory Visit: Payer: Medicare PPO

## 2021-12-18 ENCOUNTER — Ambulatory Visit (INDEPENDENT_AMBULATORY_CARE_PROVIDER_SITE_OTHER): Payer: Medicare PPO | Admitting: Internal Medicine

## 2021-12-18 ENCOUNTER — Encounter: Payer: Self-pay | Admitting: Internal Medicine

## 2021-12-18 VITALS — BP 122/62 | HR 78 | Temp 97.8°F | Ht 60.0 in | Wt 145.4 lb

## 2021-12-18 DIAGNOSIS — J849 Interstitial pulmonary disease, unspecified: Secondary | ICD-10-CM | POA: Diagnosis not present

## 2021-12-18 LAB — PULMONARY FUNCTION TEST
DL/VA % pred: 81 %
DL/VA: 3.4 ml/min/mmHg/L
DLCO cor % pred: 79 %
DLCO cor: 13.33 ml/min/mmHg
DLCO unc % pred: 79 %
DLCO unc: 13.33 ml/min/mmHg
FEF 25-75 Pre: 2.81 L/sec
FEF2575-%Pred-Pre: 246 %
FEV1-%Pred-Pre: 121 %
FEV1-Pre: 1.94 L
FEV1FVC-%Pred-Pre: 121 %
FEV6-%Pred-Pre: 106 %
FEV6-Pre: 2.17 L
FEV6FVC-%Pred-Pre: 106 %
FVC-%Pred-Pre: 100 %
FVC-Pre: 2.17 L
Pre FEV1/FVC ratio: 89 %
Pre FEV6/FVC Ratio: 100 %

## 2021-12-18 NOTE — Progress Notes (Signed)
Spirometry and Dlco done today. 

## 2021-12-18 NOTE — Progress Notes (Signed)
OV 10/18/2019  Subjective:  [name], female , DOB: Aug 24, 1940 , age 81 y.o. , MRN: [id number] , ADDRESS: [place] [address]   10/18/2019 -   Chief Complaint  Patient presents with   Consult    reports feeling at baseline today. reports cough stopped after completing recent course of abx.     [name] 81 y.o. - [place].    Then around 22 years ago moved to [facility] and subsequently to [facility].  She is here because of an abnormal CT chest.  She states some 30 years ago while in [place] area she had a cough and then a chest x-ray showed something.  She was then admitted to the hospital had a bronchoscopy and apparently and the biopsy showed sarcoidosis.  She took steroids for 6 months and then after that the pulmonologist told her she was "done".  Then she was doing fine without any respiratory issues.  Then approximately 15 years ago went to [place] to see her mom and shortly after that just before her return she got sick with respiratory issues.  When she arrived in the room she was admitted to the hospital for 7 days.  She does not know any details.  She says the doctor never give any details.  She does not remember being on oxygen.  She was discharged on steroids which she took for "short while".  She was told that the steroids clouded her diagnosis.  No specific etiology was found but then she got better after the steroids course was done.  And she been doing fine ever since.  Then approximately 6-week ago she had some benefit surgery to her left side.  This was around the hip to remove some scar.  Some week or 2 after that for the last 4 to 6 weeks she has had cough and shortness of breath.  Shortness of breath is present on exertion.  There is no wheezing or proximal nocturnal dyspnea or orthopnea.  She is now on prednisone for the last 10 days.  With this the cough is resolved but the still  shortness of breath on exertion relieved by rest.  She has no past history of pulmonary embolism or cancer connective tissue disease.  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  In my personal visualization this was also present in the lung images of an abdominal CT from September 2020.  There are some elevation of the right hemidiaphragm.  She is noted to be on losartan.  Of note she is quite active and even does some gardening work.   SYMPTOM SCALE - ILD 10/18/2019   O2 use ra  Shortness of Breath 0 -> 5 scale with 5 being worst (score 6 If unable to do)  At rest 0  Simple tasks - showers, clothes change, eating, shaving 1  Household (dishes, doing bed, laundry) 1  Shopping 1  Walking level at own pace 2  Walking up Stairs *3  Total (30-36) Dyspnea Score 8  How bad is your cough? 0  How bad is your fatigue 2  How bad is nausea 0  How bad is vomiting?  0  How bad is diarrhea? 0  How bad is anxiety? 0  How bad is depression 0       Simple office walk 185 feet x  3 laps goal with forehead probe 10/18/2019  O2 used ra  Number laps completed 3  Comments about pace moder pace - briks  Resting Pulse Ox/HR 96% and 89/min  Final Pulse Ox/HR 96% and 98/min  Desaturated </= 88% no  Desaturated <= 3% points no  Got Tachycardic >/= 90/min yes  Symptoms at end of test No dyspnea, just tired  Miscellaneous comments x     11/27/2019  - Visit   81 year old female never smoker followed in our office for interstitial lung disease. She was last seen for initial consult in August/2021 by Dr. Chase Caller. Plan of care from that office visit was as follows:  Fill out ILD questionnaire, stop prednisone today, do full pulmonary function testing, obtain autoimmune panel, follow-up in 4 to 8 weeks with Dr. Chase Caller.  Patient's connective tissue lab work was essentially negative. Mildly elevated ANA. Titer of 1: 80, reviewed by Dr. Chase Caller  and felt to be of no clinical significance. Patient presenting today after completing pulmonary function testing. Those results are listed below:  11/27/2019-pulmonary function test-FVC 1.95 (86% predicted), postbronchodilator ratio 88, postbronchodilator FEV1 1.83 (109% predicted), no bronchodilator response, TLC 3.82 (83% predicted), DLCO 12.41 (73% predicted), lower level of normal DLCO 14.15  Patient continues to report left abdominal side pain as well as left lower extremity leg pain numbness and tingling.  She plans to follow-up with primary care tomorrow.  She reports the numbness and tingling is worse at night.  She does have type 2 diabetes.  She reports that her blood sugars have been controlled but they have been persistently elevated in the past due to recurrent prednisone use.  She was recently prescribed Lasix by primary care but she did not notice much improvement.    She has an appointment with primary care tomorrow Dr. Heide Guile to discuss this.  Patient is up-to-date with COVID-19 vaccinations as well as flu vaccine.  Patient reports that she is filled out the ILD questionnaire, and she dropped this back off at our office.  Walked in office today was able to complete 2 laps with oxygen levels dropping to 88% on room air, patient did not require oxygen  Tests:   10/18/2019-connective tissue labs-positive ANA, ANA titer 1: 80, nuclear, speckled Rest of connective tissue labs negative  09/25/2019-CT chest without contrast-mild bibasilar predominant irregular peripheral interstitial and groundglass opacity generally nonspecific and not characteristics and appearance of fibrotic pulmonary sarcoidosis, indeterminate for UIP pattern, consider follow-up ILD protocol CT chest in 1 year to observe stability of fibrotic findings, occasional scattered calcified and benign small pulmonary nodules, elevation of right hemidiaphragm, 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 , MRN: [id number] , ADDRESS: [address] [place] [address] PCP Michael Boston, MD Patient Care Team: Michael Boston, MD as PCP - General (Internal Medicine) Buford Dresser, MD as PCP - Cardiology (Cardiology)  This Provider for this visit: Treatment Team:  Attending Provider: Brand Males, MD    06/20/2020 -   Chief Complaint  Patient presents with   Follow-up    Reports shortness of breath with activity. Also concerned about back pain, recently diagnosed with scoliosis.  Fu ILD NOS  HPI Deshana Marker 81 y.o. -returns for followup. Since I last saw her has seen PCP Jacalyn Lefevre, Jesse Sans, MD and APP in our office. Her breathing is stable. No cough. However, last 6 weeks has had back pain with some sciatica . Persistent. Severe pain esp with movement and twisting. Says has had MRI and shows lower back scolipsis. Has failed steroid shorts. Going to get 2nd opinion. Pain making dyspnea worse buyt overall feels ILD stable  Could not walk for walk test At home has difficulty wit ADL and changing clothes dye to back pain     ECHO Nov 2021  Echocardiogram 01/11/2020:  Normal LV systolic function with EF 61%. Left ventricle cavity is normal  in size. Mild concentric hypertrophy of the left ventricle. Normal global  wall motion.  Left atrial cavity is mildly dilated.  Trileaflet aortic valve.  Moderate (Grade III) aortic regurgitation.  Moderate (Grade II), posteriorly directed mitral regurgitation.  Mild tricuspid regurgitation.  No evidence of pulmonary hypertension    has a past medical history of Arthritis, Cervical pain, and Hyperglycemia.   reports that she has never smoked. She has never used smokeless tobacco.   OV 10/09/2020  Subjective:  [name], female , DOB: 09-10-1940 , age 57 y.o. , MRN: [id number] , ADDRESS: [place] [address] PCP Michael Boston, MD Patient Care Team: Michael Boston, MD as PCP - General (Internal Medicine) Buford Dresser, MD as PCP - Cardiology (Cardiology)  This Provider for this visit: Treatment Team:  Attending Provider: Brand Males, MD   Adriana Donovan 81 y.o. -returns for follow-up.  Since her last time symptom score a year ago symptoms are slightly worse but this because of worsening back pain.  Overall she says she feels stable in terms of shortness of breath.  She had a pulmonary function test that is stable.  She had high-resolution CT chest.  There is only mild ILD and it is also stable.  We discussed that antifibrotic therapies are indicated if there is progressive phenotype or if there is high suspicion for IPF.  She does not meet these indications.  In addition we discussed the safety profile of antifibrotic's and based on this this is not consistent with her current quality of life.  She is quite quite hampered by her low back pain.     01/29/2021: - acute sick visit Patient presents today with reports of dry cough, hoarsenss, and chest congestion since Sunday. Her symptoms have persisted since without worsening. She has experienced minimal episodes of shortness of breath with exertion that resolve quickly with rest. Her cough has been non-productive; although, she feels as though she needs to "get stuff up". She has had some nasal congestion in the mornings along with clear rhinorrhea, this subsides later in the day. She denies fever, chills, orthopnea, PND, or leg swelling. She has not had any headaches, body aches, or sore throat. She has not attempted any  over the counter remedies for symptom relief.     OV 06/05/2021  Subjective:  [name], female , DOB: [birth year] , age 24 y.o. , MRN: [id number] , ADDRESS: [place] [address] PCP Michael Boston, MD Patient Care Team: Michael Boston, MD as PCP - General (Internal Medicine) Buford Dresser, MD as PCP - Cardiology (Cardiology)  This Provider for this visit: Treatment Team:  Attending Provider: Brand Males, MD    06/05/2021 -  Chief Complaint  Patient presents with   Follow-up    Pt states she is about the same since last visit. States she has been having back pain which does make her feel a little worse.     HPI Kensie Heemstra 81 y.o. -returns for follow-up.  Last seen in the summer 2022.  Then earlier this year was seen for acute respiratory infection.  She is doing well overall.  She says that she walks 1 mile every day in 20 minutes.  She does not stop for this.  This is unchanged for the last 1 year.  Symptom score is stable.  She did have some steroids to her back because of the back pain and scoliosis which is the most bothersome problem for her.  After this her voice intensity drops.  She says that is a known problem.  Her last pulmonary function test and CT scan was last year.  She is willing to have 1 in the future.   CT Chest data  No results found.   08/28/2021: Today - acute Patient presents today for pain in her right mid back. She was worried it may be her lung. Her breathing is stable. The pain sometimes does take her breath away for a minute but once the pain resolves, this goes away. The pain only occurs upon standing/certain position changes. She describes it has a sharp, stabbing pain. She does have a history of chronic back pain related to scoliosis so is concerned it could be related to this, but wanted to make sure it was not her lungs first. She denies any cough, wheezing, hemoptysis, calf pain/swelling. She  denies recent trauma or known injury.  OV 12/18/2021  Subjective:  [name], female , DOB: 24-Dec-1940 , age 17 y.o. , MRN: [id number] , ADDRESS: [place] [address] PCP Michael Boston, MD Patient Care Team: Michael Boston, MD as PCP - General (Internal Medicine) Buford Dresser, MD as PCP - Cardiology (Cardiology)  This Provider for this visit: Treatment Team:  Attending Provider: Brand Males, MD  Mild undifferentiated ILD.  On observation and expectant follow-up supportive care.  - last PF Aug 2022 -> oct 2023  - lst CT July 2022 -> sept2023  12/18/2021 -   Chief Complaint  Patient presents with   Follow-up    Review PFT today. No sx noted today.     HPI Hareem Napoli 81 y.o. -returns for follow-up.  She continues to be on expectant approach with supportive care.  She does not want to do medications with significant side effect profile.  She again reaffirmed that.  She only says she will do the medication if needed.  She feels stable.  She says she gets some dyspnea with climbing stairs.  She has significant chronic back pain she says she is just living with it.  She is not a surgical candidate she says.  She does get steroid shots.  She had pulmonary function test and walk test and symptom score and CT scan.  All these are stable.  We took a shared decision making to continue to follow-up expectantly.       SYMPTOM SCALE - ILD 10/18/2019  10/09/2020   06/05/2021  12/18/2021   O2 use ra ra r ra0  Shortness of Breath 0 -> 5 scale with 5 being worst (score 6 If unable to do)   0  At rest 0 2 2 0  Simple tasks - showers, clothes change,  eating, shaving '1 2 2 ' 0  Household (dishes, doing bed, laundry) '1 3 2 ' 0  Shopping '1 3 2 ' 0  Walking level at own pace '2 1 1 ' 0  Walking up Stairs *'3 1 2 2  ' Total (30-36) Dyspnea Score '8 12 11 2  ' How bad is your cough? 0 0 0 0  How bad is your fatigue '2 3 2 ' 0  How bad is nausea 0 1 0 0   How bad is vomiting?  0 0 0 0  How bad is diarrhea? 0 0 0 0  How bad is anxiety? 0 0 0 0  How bad is depression 0 0 0 0       Simple office walk 185 feet x  3 laps goal with forehead probe 10/18/2019  06/20/2020  12/18/2021   O2 used ra ra ra  Number laps completed 3 Attempted 3 byt did 1 only   Comments about pace moder pace - briks    Resting Pulse Ox/HR 96% and 89/min 97% and 78 99% ad HR 78  Final Pulse Ox/HR 96% and 98/min 97% and 90 92% and HR 92  Desaturated </= 88% no no no  Desaturated <= 3% points no no yes  Got Tachycardic >/= 90/min yes yes yes  Symptoms at end of test No dyspnea, just tired Stopped due to severe back pain and nausea   Miscellaneous comments x Scoliosis issue    HRCT Sept 2023  Narrative & Impression  CLINICAL DATA:  Follow-up interstitial lung disease. No current symptoms reported.   EXAM: CT CHEST WITHOUT CONTRAST   TECHNIQUE: Multidetector CT imaging of the chest was performed following the standard protocol without intravenous contrast. High resolution imaging of the lungs, as well as inspiratory and expiratory imaging, was performed.   RADIATION DOSE REDUCTION: This exam was performed according to the departmental dose-optimization program which includes automated exposure control, adjustment of the mA and/or kV according to patient size and/or use of iterative reconstruction technique.   COMPARISON:  09/25/2020 high-resolution chest CT.   FINDINGS: Cardiovascular: Normal heart size. No significant pericardial effusion/thickening. Atherosclerotic nonaneurysmal thoracic aorta. Normal caliber pulmonary arteries.   Mediastinum/Nodes: No significant thyroid nodules. Unremarkable esophagus. No pathologically enlarged axillary, mediastinal or hilar lymph nodes, noting limited sensitivity for the detection of hilar adenopathy on this noncontrast study.   Lungs/Pleura: No pneumothorax. No pleural effusion. No acute consolidative  airspace disease, lung masses or significant pulmonary nodules. Scattered subcentimeter calcified bilateral pulmonary granulomas are unchanged. No significant lobular air trapping or evidence of tracheobronchomalacia on the expiration sequence. Mild patchy subpleural reticulation and ground-glass opacity in both lungs without significant regions of traction bronchiectasis, architectural distortion or frank honeycombing. Dependent basilar predominance to these findings. No appreciable interval progression.   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   Musculoskeletal: No aggressive appearing focal osseous lesions. Mild thoracic spondylosis. Partially visualized surgical hardware from ACDF.   IMPRESSION: 1. Spectrum of findings compatible with mild basilar predominant interstitial lung disease without honeycombing, without appreciable interval progression. 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 UIP is not excluded, but less favored. Findings are indeterminate for UIP per consensus guidelines: Diagnosis of Idiopathic Pulmonary Fibrosis: An Official ATS/ERS/JRS/ALAT Clinical Practice Guideline. [place], Iss 5, [phone], Oct 31 2016. 2. 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 3.  Aortic Atherosclerosis (ICD10-I70.0).     Electronically Signed   By: Ilona Sorrel M.D.   On: 11/27/2021 16:30       Latest Ref Rng & Units 12/18/2021    9:51 AM 10/09/2020    2:54 PM 11/27/2019   12:00 PM  PFT Results  FVC-Pre L 2.17  P 2.00  1.95   FVC-Predicted Pre % 100  P 90  86   FVC-Post L   2.08   FVC-Predicted Post %   92   Pre FEV1/FVC % % 89  P 85  84   Post FEV1/FCV % %   88   FEV1-Pre L 1.94  P 1.70  1.63   FEV1-Predicted Pre % 121  P 104  98   FEV1-Post L   1.83   DLCO uncorrected ml/min/mmHg  13.33  P 14.82  12.41   DLCO UNC% % 79  P 87  73   DLCO corrected ml/min/mmHg 13.33  P 14.82  12.41   DLCO COR %Predicted % 79  P 87  73   DLVA Predicted % 81  P 105  77   TLC L   3.82   TLC % Predicted %   83   RV % Predicted %   82     P Preliminary result       has a past medical history of Arthritis, Cervical pain, and Hyperglycemia.   reports that she has never smoked. She has never used smokeless tobacco.  Past Surgical History:  Procedure Laterality Date   ABDOMINAL HYSTERECTOMY     Per patient   SHOULDER SURGERY      Allergies  Allergen Reactions   Rosuvastatin Other (See Comments)    Other reaction(s): Blister on lips, body aches, HA    Immunization History  Administered Date(s) Administered   Fluad Quad(high Dose 65+) 11/13/2019   Influenza, Quadrivalent, Recombinant, Inj, Pf 11/10/2019   Janssen (J&J) SARS-COV-2 Vaccination 05/12/2019   Moderna Sars-Covid-2 Vaccination 05/24/2019, 12/24/2019   Pneumococcal Conjugate-13 11/13/2019   Pneumococcal-Unspecified 11/10/2019    Family History  Problem Relation Age of Onset   Diabetes Father    Leukemia Brother      Current Outpatient Medications:    ACCU-CHEK AVIVA PLUS test strip, , Disp: , Rfl:    Accu-Chek FastClix Lancets MISC, , Disp: , Rfl:    empagliflozin (JARDIANCE) 10 MG TABS tablet, Take 10 mg by mouth daily., Disp: , Rfl:    Estrogens Conjugated (PREMARIN PO), Take by mouth., Disp: , Rfl:    EYSUVIS 0.25 % SUSP, , Disp: , Rfl:    furosemide (LASIX) 20 MG tablet, Take 20 mg by mouth daily., Disp: , Rfl:    glimepiride (AMARYL) 2 MG tablet, Take 2 mg by mouth daily with breakfast., Disp: , Rfl:    losartan (COZAAR) 25 MG tablet, , Disp: , Rfl:    meclizine (ANTIVERT) 12.5 MG tablet, Take 1 tablet (12.5 mg total) by mouth 3 (three) times daily as needed for dizziness., Disp: 30 tablet, Rfl: 0   Vitamin D, Ergocalciferol, (DRISDOL) 1.25 MG (50000 UNIT) CAPS capsule, , Disp: , Rfl:    albuterol  (PROVENTIL) (2.5 MG/3ML) 0.083% nebulizer solution, Take 3 mLs (2.5 mg total) by nebulization every 6 (six) hours as needed for wheezing or shortness of breath. (Patient not taking: Reported on 06/05/2021), Disp: 75 mL, Rfl: 12   albuterol (VENTOLIN HFA) 108 (90 Base) MCG/ACT inhaler, INHALE 2 PUFFS INTO THE LUNGS EVERY 6 HOURS AS NEEDED  FOR WHEEZING OR SHORTNESS OF BREATH (Patient not taking: Reported on 06/05/2021), Disp: 18 g, Rfl: 3   Cholecalciferol 25 MCG (1000 UT) capsule, TK 1 C PO QD (Patient not taking: Reported on 12/18/2021), Disp: , Rfl:    GLIPIZIDE PO, Take by mouth. Only occasionally (Patient not taking: Reported on 12/18/2021), Disp: , Rfl:    lidocaine (LIDODERM) 5 %, Place 1 patch onto the skin daily. Remove & Discard patch within 12 hours or as directed by MD (Patient not taking: Reported on 12/18/2021), Disp: 14 patch, Rfl: 0      Objective:   There were no vitals filed for this visit.  Estimated body mass index is 29.49 kg/m as calculated from the following:   Height as of 08/28/21: 5' (1.524 m).   Weight as of 08/28/21: 151 lb (68.5 kg).  '@WEIGHTCHANGE' @  There were no vitals filed for this visit.   Physical Exam    General: No distress. Looks well Neuro: Alert and Oriented x 3. GCS 15. Speech normal Psych: Pleasant Resp:  Barrel Chest - no.  Wheeze - no, Crackles -  R > L bae, No overt respiratory distress CVS: Normal heart sounds. Murmurs - no Ext: Stigmata of Connective Tissue Disease - no HEENT: Normal upper airway. PEERL +. No post nasal drip        Assessment:       ICD-10-CM   1. ILD (interstitial lung disease) ([place])  J84.9          Plan:     Patient Instructions  ILD (interstitial lung disease) ([place])  -Clinically undifferentiated, mild and stable 2021 -> 2023 October - Noted desire to avoid polypharmacy and medications with significant side effect  Plan - - do spiro/dlco in 67month - no anti-fibrotic   Follow-up - Return in 6 months  but after spiro/dlco  - symptom score and walk test at followup    SIGNATURE    Dr. MBrand Males M.D., F.C.C.P,  Pulmonary and Critical Care Medicine Staff Physician, CHeathsvilleDirector - Interstitial Lung Disease  Program  [place]ulmonary [place] [id number] Pager: [phone] If no answer or between  15:00h - 7:00h: call [phone] Telephone: [phone]  11:31 AM 12/18/2021

## 2021-12-18 NOTE — Patient Instructions (Addendum)
ILD (interstitial lung disease) ([place])  -Clinically undifferentiated, mild and stable 2021 -> 2023 October - Noted desire to avoid polypharmacy and medications with significant side effect  Plan - - do spiro/dlco in 41months - no anti-fibrotic   Follow-up - Return in 6 months but after spiro/dlco  - symptom score and walk test at followup

## 2022-01-28 ENCOUNTER — Ambulatory Visit (HOSPITAL_BASED_OUTPATIENT_CLINIC_OR_DEPARTMENT_OTHER): Payer: Medicare PPO | Admitting: Cardiology

## 2022-02-20 ENCOUNTER — Ambulatory Visit (INDEPENDENT_AMBULATORY_CARE_PROVIDER_SITE_OTHER): Payer: Medicare PPO | Admitting: Cardiology

## 2022-02-20 ENCOUNTER — Encounter (HOSPITAL_BASED_OUTPATIENT_CLINIC_OR_DEPARTMENT_OTHER): Payer: Self-pay | Admitting: Cardiology

## 2022-02-20 VITALS — BP 130/64 | HR 70 | Ht 60.0 in | Wt 149.8 lb

## 2022-02-20 DIAGNOSIS — J849 Interstitial pulmonary disease, unspecified: Secondary | ICD-10-CM | POA: Diagnosis not present

## 2022-02-20 DIAGNOSIS — R0609 Other forms of dyspnea: Secondary | ICD-10-CM

## 2022-02-20 DIAGNOSIS — I351 Nonrheumatic aortic (valve) insufficiency: Secondary | ICD-10-CM | POA: Diagnosis not present

## 2022-02-20 DIAGNOSIS — I340 Nonrheumatic mitral (valve) insufficiency: Secondary | ICD-10-CM

## 2022-02-20 NOTE — Patient Instructions (Signed)
Medication Instructions:  Your physician recommends that you continue on your current medications as directed. Please refer to the Current Medication list given to you today.   *If you need a refill on your cardiac medications before your next appointment, please call your pharmacy*  Lab Work: NONE  Testing/Procedures: NONE  Follow-Up: At [facility], you and your health needs are our priority.  As part of our continuing mission to provide you with exceptional heart care, we have created designated Provider Care Teams.  These Care Teams include your primary Cardiologist (physician) and Advanced Practice Providers (APPs -  Physician Assistants and Nurse Practitioners) who all work together to provide you with the care you need, when you need it.  We recommend signing up for the patient portal called "MyChart".  Sign up information is provided on this After Visit Summary.  MyChart is used to connect with patients for Virtual Visits (Telemedicine).  Patients are able to view lab/test results, encounter notes, upcoming appointments, etc.  Non-urgent messages can be sent to your provider as well.   To learn more about what you can do with MyChart, go to [email or web address].    Your next appointment:   12 month(s)  The format for your next appointment:   In Person  Provider:   Bridgette Christopher, MD    Important Information About Sugar       

## 2022-02-20 NOTE — Progress Notes (Signed)
Cardiology Office Note:    Date:  02/20/2022   ID:  Doreena Maulden, DOB [birth year], MRN [id number]  PCP:  Michael Boston, MD  Cardiologist:  Buford Dresser, MD  Referring MD: Michael Boston, MD   CC: follow up  History of Present Illness:    Adriana Donovan is a 81 y.o. female with a hx of sarcoidosis, type II diabetes, hypertension, interstitial lung disease who is seen for follow up today. I initially met her 03/14/20 as a new consult at the request of Jacalyn Lefevre Jesse Sans, MD for the evaluation and management of bilateral LE edema.  Referral from 02/28/20 notes bilateral LE edema, chronic kidney disease state 2, type II diabetes. She had an echo done by [facility] 01/12/20 as noted below, normal EF, moderate AR, moderate MR.  She was doing well at her last visit with me on 07/30/20. She noted some persistent but unchanging DOE as well as some BLE swelling at that time. She was using Lasix daily with good control.  She has since been evaluated by PA Caron Presume on 01/27/22 and was doing well at that time. She reported that her most significant limited symptoms were associated with her chronic back pain/scoliosis. She was using her Lasix 23m daily and wearing compression socks with good control of her BLE edema.  Today, she states that she is doing well from a CV perspective. She states that her breathing has been good. She was last seen by pulmonology Dr. RChase Callerfor follow up on 12/18/21.   She wears compressions socks with good control of her BLE swelling L>R. She states that she may go several months at a time without any swelling.  She has had 1 mechanical fall outside over 6 months ago. She was evaluated by multiple providers afterward and was not found to have any fractures or other injuries. She denies any falls since that time.  She states that she has been dealing with chronic low back pain and lower extremity radiculopathy secondary to spinal stenosis. She  has tried multiple injections including epidural steroid injection. She was last seen by Dr. EDavy Piqueon 02/11/22. Her back pain waxes and wanes but often limits her activity.   Past Medical History:  Diagnosis Date   Arthritis    Cervical pain    Hyperglycemia     Past Surgical History:  Procedure Laterality Date   ABDOMINAL HYSTERECTOMY     Per patient   SHOULDER SURGERY      Current Medications: Current Outpatient Medications on File Prior to Visit  Medication Sig   ACCU-CHEK AVIVA PLUS test strip    Accu-Chek FastClix Lancets MISC    Cholecalciferol 25 MCG (1000 UT) capsule    empagliflozin (JARDIANCE) 10 MG TABS tablet Take 10 mg by mouth daily.   Estrogens Conjugated (PREMARIN PO) Take by mouth.   EYSUVIS 0.25 % SUSP    furosemide (LASIX) 20 MG tablet Take 20 mg by mouth daily.   glimepiride (AMARYL) 2 MG tablet Take 2 mg by mouth daily with breakfast.   losartan (COZAAR) 25 MG tablet    meclizine (ANTIVERT) 12.5 MG tablet Take 1 tablet (12.5 mg total) by mouth 3 (three) times daily as needed for dizziness.   No current facility-administered medications on file prior to visit.     Allergies:   Rosuvastatin   Social History   Tobacco Use   Smoking status: Never   Smokeless tobacco: Never  Vaping Use   Vaping Use: Never used  Substance Use Topics   Alcohol use: Never   Drug use: Never    Family History: family history includes Diabetes in her father; Leukemia in her brother.  ROS:   Please see the history of present illness.   + Back pain Additional pertinent ROS otherwise unremarkable.  EKGs/Labs/Other Studies Reviewed:    The following studies were reviewed today:  Echo 01/11/20: Report from [facility]: Normal LV systolic function with EF 61%. Left ventricle cavity is normal  in size. Mild concentric hypertrophy of the left ventricle. Normal global  wall motion.  Left atrial cavity is mildly dilated.  Trileaflet aortic valve.  Moderate (Grade III)  aortic regurgitation.  Moderate (Grade II), posteriorly directed mitral regurgitation.  Mild tricuspid regurgitation.  No evidence of pulmonary hypertension.Normal LV systolic function with EF 61%. Left ventricle cavity is normal  in size. Mild concentric hypertrophy of the left ventricle. Normal global  wall motion.  Left atrial cavity is mildly dilated.  Trileaflet aortic valve.  Moderate (Grade III) aortic regurgitation.  Moderate (Grade II), posteriorly directed mitral regurgitation.  Mild tricuspid regurgitation.  No evidence of pulmonary hypertension.  On my review of the full report, there is no mention of diastolic parameters.   EKG:  EKG ordered today, 02/20/22.  The ekg ordered today demonstrates NSR at 70 bpm  03/14/20: NSR at 87 bpm  Recent Labs: No results found for requested labs within last 365 days.  Recent Lipid Panel No results found for: "CHOL", "TRIG", "HDL", "CHOLHDL", "VLDL", "LDLCALC", "LDLDIRECT"  Physical Exam:    VS:  BP 130/64 (BP Location: Right Arm, Patient Position: Sitting, Cuff Size: Normal)   Pulse 70   Ht 5' (1.524 m)   Wt 149 lb 12.8 oz (67.9 kg)   BMI 29.26 kg/m     Wt Readings from Last 3 Encounters:  02/20/22 149 lb 12.8 oz (67.9 kg)  12/18/21 145 lb 6.4 oz (66 kg)  08/28/21 151 lb (68.5 kg)   GEN: Well nourished, well developed in no acute distress HEENT: Normal, moist mucous membranes NECK: No JVD CARDIAC: regular rhythm, normal S1 and S2, no rubs or gallops. 1/6 systolic murmur  VASCULAR: Radial and DP pulses 2+ bilaterally. No carotid bruits RESPIRATORY:  Clear to auscultation bilaterally ABDOMEN: Soft, non-tender, non-distended MUSCULOSKELETAL:  Ambulates independently SKIN: Warm and dry, trivial bilateral LE edema NEUROLOGIC:  Alert and oriented x 3. No focal neuro deficits noted. PSYCHIATRIC:  Normal affect   ASSESSMENT:    1. Aortic valve insufficiency, etiology of cardiac valve disease unspecified   2. Dyspnea on  exertion   3. Mitral valve insufficiency, unspecified etiology   4. ILD (interstitial lung disease) (HCC)     PLAN:    Bilateral LE edema Chronic dyspnea on exertion Mitral valve insufficiency Aortic valve insufficiency -trivial edema today. No JVD, PND, orthopnea. Not improved by diuresis -we discussed evaluation options for MR/AR. After shared decision making, will follow clinically at this time, repeat echo for worsening symptoms or on routine monitoring scheduled -weights stable, recommended lasix as needed for swelling or weight gain -dyspnea also under evaluation from pulmonary standpoint given sarcoidosis/ILD. Also has history of positive ANA  Cardiac risk counseling and prevention recommendations: -recommend heart healthy/Mediterranean diet, with whole grains, fruits, vegetable, fish, lean meats, nuts, and olive oil. Limit salt. -recommend moderate walking, 3-5 times/week for 30-50 minutes each session. Aim for at least 150 minutes.week. Goal should be pace of 3 miles/hours, or walking 1.5 miles in 30 minutes -recommend avoidance of tobacco  products. Avoid excess alcohol. -ASCVD risk score: The ASCVD Risk score (Arnett DK, et al., 2019) failed to calculate for the following reasons:   The 2019 ASCVD risk score is only valid for ages 64 to 59    Plan for follow up: 1 year or sooner if needed  Buford Dresser, MD, PhD, [facility]    Medication Adjustments/Labs and Tests Ordered: Current medicines are reviewed at length with the patient today.  Concerns regarding medicines are outlined above.  Orders Placed This Encounter  Procedures   EKG 12-Lead   No orders of the defined types were placed in this encounter.   Patient Instructions  Medication Instructions:  Your physician recommends that you continue on your current medications as directed. Please refer to the Current Medication list given to you today.   *If you need a refill on your  cardiac medications before your next appointment, please call your pharmacy*  Lab Work: NONE  Testing/Procedures: NONE  Follow-Up: At [facility], you and your health needs are our priority.  As part of our continuing mission to provide you with exceptional heart care, we have created designated Provider Care Teams.  These Care Teams include your primary Cardiologist (physician) and Advanced Practice Providers (APPs -  Physician Assistants and Nurse Practitioners) who all work together to provide you with the care you need, when you need it.  We recommend signing up for the patient portal called "MyChart".  Sign up information is provided on this After Visit Summary.  MyChart is used to connect with patients for Virtual Visits (Telemedicine).  Patients are able to view lab/test results, encounter notes, upcoming appointments, etc.  Non-urgent messages can be sent to your provider as well.   To learn more about what you can do with MyChart, go to [email or web address].    Your next appointment:   12 month(s)  The format for your next appointment:   In Person  Provider:   Buford Dresser, MD   Important Information About [place] as a scribe for Buford Dresser, MD.,have documented all relevant documentation on the behalf of Buford Dresser, MD,as directed by  Buford Dresser, MD while in the presence of Buford Dresser, MD.  I, Buford Dresser, MD, have reviewed all documentation for this visit. The documentation on 03/03/22 for the exam, diagnosis, procedures, and orders are all accurate and complete.   Signed, Buford Dresser, MD PhD 02/20/2022     [place]

## 2022-02-26 ENCOUNTER — Telehealth: Payer: Self-pay | Admitting: Internal Medicine

## 2022-02-26 NOTE — Telephone Encounter (Addendum)
Pain with movement right side of lung (sharpe pain).  It stays all the time if she stands up.  No pain when sitting.   On the right side on the back (towards last rib on bottom).  Has to hold breath to move.  Takes Ibuprofen/aleve and does not help.  Did not try heat or ice.  Started about a month ago.  No history of fever, chils or body aches.  She denies any use of heat or cold.  She also denies any falls.  Scheduled her an OV on 03/03/2022 with Kandice Robinsons NP  Nothing further needed.

## 2022-03-03 ENCOUNTER — Encounter (HOSPITAL_BASED_OUTPATIENT_CLINIC_OR_DEPARTMENT_OTHER): Payer: Self-pay | Admitting: Cardiology

## 2022-03-03 ENCOUNTER — Other Ambulatory Visit: Payer: Self-pay | Admitting: Acute Care

## 2022-03-03 ENCOUNTER — Ambulatory Visit (INDEPENDENT_AMBULATORY_CARE_PROVIDER_SITE_OTHER): Payer: Medicare PPO

## 2022-03-03 ENCOUNTER — Encounter: Payer: Self-pay | Admitting: Acute Care

## 2022-03-03 ENCOUNTER — Ambulatory Visit (INDEPENDENT_AMBULATORY_CARE_PROVIDER_SITE_OTHER): Payer: Medicare PPO | Admitting: Acute Care

## 2022-03-03 VITALS — BP 100/58 | HR 64 | Temp 97.8°F | Ht 61.0 in | Wt 146.4 lb

## 2022-03-03 DIAGNOSIS — M4800 Spinal stenosis, site unspecified: Secondary | ICD-10-CM | POA: Diagnosis not present

## 2022-03-03 DIAGNOSIS — M419 Scoliosis, unspecified: Secondary | ICD-10-CM

## 2022-03-03 DIAGNOSIS — J849 Interstitial pulmonary disease, unspecified: Secondary | ICD-10-CM

## 2022-03-03 DIAGNOSIS — G8929 Other chronic pain: Secondary | ICD-10-CM

## 2022-03-03 DIAGNOSIS — M549 Dorsalgia, unspecified: Secondary | ICD-10-CM

## 2022-03-03 NOTE — Progress Notes (Unsigned)
History of Present Illness Adriana Donovan is a 82 y.o. female never smoker from [place] with imaging consistent with  ILD , most suggestive of NSIP, and other PMH of sarcoidosis, type II diabetes, hypertension,Mitral valve insufficiency and Aortic valve insufficiency  . She is followed by Dr. Marchelle Donovan for her ILD/ sarcoid. She is followed by cardiology.   03/03/2022 Pt. Presents to the office today with complaints of sharp pain with movement of the right side of the lung. Pain is on the right back toward the lower rib. She states she has to hold her breath to move . Ibuprofen and Aleve do not help the pain. She has not tried heat or ice. This pain started about 1 month ago. No history of fever, chills or body aches.  She denies any use of heat or cold.  She also denies any falls. She has a history of chronic back pain and scoliosis and wanted to make sure this was not her lungs before seeking orthopedic assistance. She does not have any respiratory complaints. NO cough, wheeze , hemoptysis, calf pain or swelling.    Pt. Does have a history of  lumbar  stenosis. She states she does not think this is her spinal stenosis, but wanted to make sure it was not her lungs. .   Test Results: CXR 03/03/2022>> pending   Echocardiogram 01/11/2020:  Normal LV systolic function with EF 61%. Left ventricle cavity is normal  in size. Mild concentric hypertrophy of the left ventricle. Normal global  wall motion.  Left atrial cavity is mildly dilated.  Trileaflet aortic valve.  Moderate (Grade III) aortic regurgitation.  Moderate (Grade II), posteriorly directed mitral regurgitation.  Mild tricuspid regurgitation.  No evidence of pulmonary hypertension  10/18/2019-connective tissue labs-positive ANA, ANA titer 1: 80, nuclear, speckled Rest of connective tissue labs negative   09/25/2019-CT chest without contrast-mild bibasilar predominant irregular peripheral interstitial and groundglass opacity generally  nonspecific and not characteristics and appearance of fibrotic pulmonary sarcoidosis, indeterminate for UIP pattern, consider follow-up ILD protocol CT chest in 1 year to observe stability of fibrotic findings, occasional scattered calcified and benign small pulmonary nodules, elevation of right hemidiaphragm, 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8   Chloride 98 - 111 mmol/L 102     BNP No results found for: "BNP"  ProBNP No results found for: "PROBNP"  PFT    Component Value Date/Time   FEV1PRE 1.94 12/18/2021 0951   FEV1POST 1.83 11/27/2019 1200   FVCPRE 2.17 12/18/2021 0951   FVCPOST 2.08 11/27/2019 1200   TLC 3.82 11/27/2019 1200   DLCOUNC 13.33 12/18/2021 0951   PREFEV1FVCRT 89 12/18/2021 0951   PSTFEV1FVCRT 88 11/27/2019 1200    No  results found.   Past medical hx Past Medical History:  Diagnosis Date   Arthritis    Cervical pain    Hyperglycemia      Social History   Tobacco Use   Smoking status: Never   Smokeless tobacco: Never  Vaping Use   Vaping Use: Never used  Substance Use Topics   Alcohol use: Never   Drug use: Never    Ms.Adriana Donovan reports that she has never smoked. She has never used smokeless tobacco. She reports that she does not drink alcohol and does not use drugs.  Tobacco Cessation: Counseling  given: Not Answered   Past surgical hx, Family hx, Social hx all reviewed.  Current Outpatient Medications on File Prior to Visit  Medication Sig   ACCU-CHEK AVIVA PLUS test strip    Accu-Chek FastClix Lancets MISC    Cholecalciferol 25 MCG (1000 UT) capsule    empagliflozin (JARDIANCE) 10 MG TABS tablet Take 10 mg by mouth daily.   Estrogens Conjugated (PREMARIN PO) Take by mouth.   EYSUVIS 0.25 % SUSP    furosemide (LASIX) 20 MG tablet Take 20 mg by mouth daily.   glimepiride (AMARYL) 2 MG tablet Take 2 mg by mouth daily with breakfast.   losartan (COZAAR) 25 MG tablet    meclizine (ANTIVERT) 12.5 MG tablet Take 1 tablet (12.5 mg total) by mouth 3 (three) times daily as needed for dizziness.   No current facility-administered medications on file prior to visit.     Allergies  Allergen Reactions   Rosuvastatin Other (See Comments)    Other reaction(s): Blister on lips, body aches, HA    Review Of Systems:  Constitutional:   No  weight loss, night sweats,  Fevers, chills, fatigue, or  lassitude.  HEENT:   No headaches,  Difficulty swallowing,  Tooth/dental problems, or  Sore throat,                No sneezing, itching, ear ache, nasal congestion, post nasal drip,   CV:  No chest pain,  Orthopnea, PND, swelling in lower extremities, anasarca, dizziness, palpitations, syncope.   GI  No heartburn, indigestion, abdominal pain, nausea, vomiting, diarrhea, change in bowel habits, loss of appetite, bloody stools.   Resp: No shortness of breath with exertion or at rest.  No excess mucus, no productive cough,  No non-productive cough,  No coughing up of blood.  No change in color of mucus.  No wheezing.  No chest wall deformity  Skin: no rash or lesions.  GU: no dysuria, change in color of urine, no urgency or frequency.  No flank pain, no hematuria   MS:  No joint pain or swelling.  No decreased range of motion.  ++chronic back pain , sharp, right sided.   Psych:  No change in  mood or affect. No depression or anxiety.  No memory loss.   Vital Signs There were no vitals taken for this visit.   Physical Exam:  General- No distress,  A&Ox3 ENT: No sinus tenderness, TM clear, pale nasal mucosa, no oral exudate,no post nasal drip, no LAN Cardiac: S1, S2, regular rate and rhythm, no murmur Chest: No wheeze/ rales/ dullness; no accessory muscle use, no nasal flaring, no sternal retractions, + breathlessness with pain episodes  Abd.: Soft Non-tender Ext: No clubbing cyanosis, edema MSK:  Neuro:  normal strength Skin: No rashes, warm and dry Psych: normal mood and behavior   Assessment/Plan  No problem-specific Assessment & Plan notes found for this encounter.  Magdalen Spatz, NP 03/03/2022  9:04 AM

## 2022-03-03 NOTE — Patient Instructions (Addendum)
It is good to see you today. We will do a CXR to see if we can determine if the pain you are experiencing is lung related.  I will call you with the results.  If CXR does not show any lung issues, follow up with the physician who has been treating your back pain.  Apply cold to the area of pain as you have been doing.  You can try Ibuprofen and tylenol; as you have been doing. Albuterol as needed for shortness of breath. You need follow up with Dr. Chase Caller in 06/2022 after DLCO and Spirometry.. I will order this today. Next CT Chest is due 11/2022. Please contact office for sooner follow up if symptoms do not improve or worsen or seek emergency care

## 2022-03-03 NOTE — Progress Notes (Addendum)
History of Present Illness Adriana Donovan is a 82 y.o. female  never smoker from [place] with imaging consistent with  ILD , most suggestive of NSIP, and other PMH of sarcoidosis, type II diabetes, hypertension,Mitral valve insufficiency and Aortic valve insufficiency  . She is followed by Dr. Marchelle Gearing for her ILD/ sarcoid. She is followed by cardiology.    03/03/2022 Pt. Presents to the office today with complaints of sharp pain with movement of the right side of the lung. Pain is on the right back toward the lower rib. She states she has to hold her breath to move . Ibuprofen and Aleve do not help the pain. She has not tried heat or ice. This pain started about 1 month ago. No history of fever, chills or body aches.  She denies any use of heat or cold.  She also denies any falls. She has a history of chronic back pain and scoliosis and wanted to make sure this was not her lungs before seeking orthopedic assistance. She does not have any respiratory complaints. NO cough, wheeze , hemoptysis, calf pain or swelling.     Pt. Does have a history of  lumbar  stenosis. She states she does not think this is her spinal stenosis, but wanted to make sure it was not her lungs.Vitals are stable and her oxygen saturation is 100%.  Test Results: Test Results: CXR 03/03/2022 The heart size and mediastinal contours are within normal limits. Both lungs are clear. Stable elevated right hemidiaphragm. The visualized skeletal structures are unremarkable.   IMPRESSION: No active disease.  There results were reviewed by me and shared with the patient during the office visit.     Echocardiogram 01/11/2020:  Normal LV systolic function with EF 61%. Left ventricle cavity is normal  in size. Mild concentric hypertrophy of the left ventricle. Normal global  wall motion.  Left atrial cavity is mildly dilated.  Trileaflet aortic valve.  Moderate (Grade III) aortic regurgitation.  Moderate (Grade II), posteriorly  directed mitral regurgitation.  Mild tricuspid regurgitation.  No evidence of pulmonary hypertension   10/18/2019-connective tissue labs-positive ANA, ANA titer 1: 80, nuclear, speckled Rest of connective tissue labs negative   09/25/2019-CT chest without contrast-mild bibasilar predominant irregular peripheral interstitial and groundglass opacity generally nonspecific and not characteristics and appearance of fibrotic pulmonary sarcoidosis, indeterminate for UIP pattern, consider follow-up ILD protocol CT chest in 1 year to observe stability of fibrotic findings, occasional scattered calcified and benign small pulmonary nodules, elevation of right hemidiaphragm, 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     11/27/2019-pulmonary function test-FVC 1.95 (86% predicted), postbronchodilator ratio 88, postbronchodilator FEV1 1.83 (109% predicted), no bronchodilator response, TLC 3.82 (83% predicted), DLCO 12.41 (73% predicted), lower level of normal DLCO 14.15          Latest Ref Rng & Units 10/09/2017    5:24 PM      Latest Ref Rng & Units 10/09/2017    5:24 PM  CBC  Hemoglobin 12.0 - 15.0 g/dL 30.0   Hematocrit 76.2 - 46.0 % 45.0        Latest Ref Rng & Units 10/09/2017    5:24 PM  BMP  Glucose 70 - 99 mg/dL 263   BUN 8 - 23 mg/dL 14   Creatinine 3.35 - 1.00 mg/dL 4.56   Sodium 256 - 389 mmol/L 134   Potassium 3.5 - 5.1 mmol/L 3.8   Chloride 98 - 111 mmol/L  102     BNP No results found for: "BNP"  ProBNP No results found for: "PROBNP"  PFT    Component Value Date/Time   FEV1PRE 1.94 12/18/2021 0951   FEV1POST 1.83 11/27/2019 1200   FVCPRE 2.17 12/18/2021 0951   FVCPOST 2.08 11/27/2019 1200   TLC 3.82 11/27/2019 1200   DLCOUNC 13.33 12/18/2021 0951   PREFEV1FVCRT 89 12/18/2021 0951   PSTFEV1FVCRT 88 11/27/2019 1200    DG Chest 1 View  Result Date: 03/03/2022 CLINICAL DATA:  Back pain. EXAM: CHEST  1 VIEW  COMPARISON:  August 28, 2021. FINDINGS: The heart size and mediastinal contours are within normal limits. Both lungs are clear. Stable elevated right hemidiaphragm. The visualized skeletal structures are unremarkable. IMPRESSION: No active disease. Electronically Signed   By: Marijo Conception M.D.   On: 03/03/2022 09:30     Past medical hx Past Medical History:  Diagnosis Date   Arthritis    Cervical pain    Hyperglycemia      Social History   Tobacco Use   Smoking status: Never   Smokeless tobacco: Never  Vaping Use   Vaping Use: Never used  Substance Use Topics   Alcohol use: Never   Drug use: Never    [name] reports that she has never smoked. She has never used smokeless tobacco. She reports that she does not drink alcohol and does not use drugs.  Tobacco Cessation:  NA  Past surgical hx, Family hx, Social hx all reviewed.  Current Outpatient Medications on File Prior to Visit  Medication Sig   ACCU-CHEK AVIVA PLUS test strip    Accu-Chek FastClix Lancets MISC    Cholecalciferol 25 MCG (1000 UT) capsule    empagliflozin (JARDIANCE) 10 MG TABS tablet Take 10 mg by mouth daily.   EYSUVIS 0.25 % SUSP    furosemide (LASIX) 20 MG tablet Take 20 mg by mouth daily.   glimepiride (AMARYL) 2 MG tablet Take 2 mg by mouth daily with breakfast.   losartan (COZAAR) 25 MG tablet    meclizine (ANTIVERT) 12.5 MG tablet Take 1 tablet (12.5 mg total) by mouth 3 (three) times daily as needed for dizziness.   montelukast (SINGULAIR) 10 MG tablet 1 tablet Orally Once a day   PREMARIN 0.625 MG tablet Take 0.625 mg by mouth daily.   Estrogens Conjugated (PREMARIN PO) Take by mouth. (Patient not taking: Reported on 03/03/2022)   No current facility-administered medications on file prior to visit.     Allergies  Allergen Reactions   Rosuvastatin Other (See Comments)    Other reaction(s): Blister on lips, body aches, HA    Review Of Systems:  Constitutional:   No  weight loss, night  sweats,  Fevers, chills, fatigue, or  lassitude.  HEENT:   No headaches,  Difficulty swallowing,  Tooth/dental problems, or  Sore throat,                No sneezing, itching, ear ache, nasal congestion, post nasal drip,   CV:  No chest pain,  Orthopnea, PND, swelling in lower extremities, anasarca, dizziness, palpitations, syncope.   GI  No heartburn, indigestion, abdominal pain, nausea, vomiting, diarrhea, change in bowel habits, loss of appetite, bloody stools.   Resp: No shortness of breath with exertion or at rest.  No excess mucus, no productive cough,  No non-productive cough,  No coughing up of blood.  No change in color of mucus.  No wheezing.  No chest wall deformity  Skin:  no rash or lesions.  GU: no dysuria, change in color of urine, no urgency or frequency.  No flank pain, no hematuria   MS:  No joint pain or swelling.  No decreased range of motion.  ++chronic back pain , sharp, right sided.    Psych:  No change in mood or affect. No depression or anxiety.  No memory loss.   Vital Signs BP (!) 100/58 (BP Location: Left Arm, Cuff Size: Normal)   Pulse 64   Temp 97.8 F (36.6 C) (Oral)   Ht 5\' 1"  (1.549 m)   Wt 146 lb 6.4 oz (66.4 kg)   SpO2 100%   BMI 27.66 kg/m    Physical Exam:  General- No distress,  A&Ox3 ENT: No sinus tenderness, TM clear, pale nasal mucosa, no oral exudate,no post nasal drip, no LAN Cardiac: S1, S2, regular rate and rhythm, no murmur Chest: No wheeze/ rales/ dullness; no accessory muscle use, no nasal flaring, no sternal retractions, + breathlessness with pain episodes  due to holding breath. Abd.: Soft Non-tender, ND, BS +, Body mass index is 27.66 kg/m.  Ext: No clubbing cyanosis, edema Neuro:  normal strength, MAE x 4, A&O x 3, appropriate Skin: No rashes, warm and dry, intact Psych: normal mood and behavior   Assessment/Plan  Chronic Back pain with worsening of Right sided musculoskeletal pain in patient with ILD Hx. Of scoliosis  and spinal and lumbar stenosis Previous use of Lidocaine patches did not help NSAIDs do not help Heat makes it worse, cold helps.  Plan It is good to see you today. We will do a CXR to see if we can determine if the pain you are experiencing is lung related.  CXR shows clear lungs.   If CXR does not show any lung issues, follow up with the physician who has been treating your back pain, I think this is muscular skeletal pain.  Apply cold to the area of pain as you have been doing, since this helps.  You can try Ibuprofen and tylenol; as you have been doing. Albuterol as needed for shortness of breath. You need follow up with Dr. Chase Caller in 06/2022 after DLCO and Spirometry as follow up to your ILD.. I will order this today. Next CT Chest is due 11/2022, which I have not ordered, but will be ordered 06/2022 appointment  Please contact office for sooner follow up if symptoms do not improve or worsen or seek emergency care    I spent 35 minutes dedicated to the care of this patient on the date of this encounter to include pre-visit review of records, face-to-face time with the patient discussing conditions above, post visit ordering of testing, clinical documentation with the electronic health record, making appropriate referrals as documented, and communicating necessary information to the patient's healthcare team.     Magdalen Spatz, NP 03/03/2022  9:47 AM

## 2022-07-07 ENCOUNTER — Encounter: Payer: Self-pay | Admitting: Internal Medicine

## 2022-07-07 ENCOUNTER — Ambulatory Visit (INDEPENDENT_AMBULATORY_CARE_PROVIDER_SITE_OTHER): Payer: Medicare PPO | Admitting: Internal Medicine

## 2022-07-07 VITALS — BP 130/70 | HR 73 | Ht 61.0 in | Wt 149.0 lb

## 2022-07-07 DIAGNOSIS — J849 Interstitial pulmonary disease, unspecified: Secondary | ICD-10-CM

## 2022-07-07 LAB — PULMONARY FUNCTION TEST
DL/VA % pred: 100 %
DL/VA: 4.15 ml/min/mmHg/L
DLCO cor % pred: 97 %
DLCO cor: 16.27 ml/min/mmHg
DLCO unc % pred: 97 %
DLCO unc: 16.27 ml/min/mmHg
FEF 25-75 Pre: 0.52 L/sec
FEF2575-%Pred-Pre: 47 %
FEV1-%Pred-Pre: 54 %
FEV1-Pre: 0.85 L
FEV1FVC-%Pred-Pre: 54 %
FEV6-%Pred-Pre: 106 %
FEV6-Pre: 2.12 L
FEV6FVC-%Pred-Pre: 106 %
FVC-%Pred-Pre: 99 %
FVC-Pre: 2.12 L
Pre FEV1/FVC ratio: 40 %
Pre FEV6/FVC Ratio: 100 %

## 2022-07-07 NOTE — Progress Notes (Signed)
Spiro/DLCO performed today.  

## 2022-07-07 NOTE — Progress Notes (Signed)
OV 10/18/2019  Subjective:  [name], female , DOB: [birth year] , age 82 y.o. , MRN: [id number] , ADDRESS: [address] [name] [address]   10/18/2019 -   Chief Complaint  Patient presents with   Consult    reports feeling at baseline today. reports cough stopped after completing recent course of abx.     [name] 82 y.o. - [place] immigrant.    Then around 22 years ago moved to [facility] and subsequently to [facility].  She is here because of an abnormal CT chest.  She states some 30 years ago while in [place] area she had a cough and then a chest x-ray showed something.  She was then admitted to the hospital had a bronchoscopy and apparently and the biopsy showed sarcoidosis.  She took steroids for 6 months and then after that the pulmonologist told her she was "done".  Then she was doing fine without any respiratory issues.  Then approximately 15 years ago went to [place] to see her mom and shortly after that just before her return she got sick with respiratory issues.  When she arrived in the room she was admitted to the hospital for 7 days.  She does not know any details.  She says the doctor never give any details.  She does not remember being on oxygen.  She was discharged on steroids which she took for "short while".  She was told that the steroids clouded her diagnosis.  No specific etiology was found but then she got better after the steroids course was done.  And she been doing fine ever since.  Then approximately 6-week ago she had some benefit surgery to her left side.  This was around the hip to remove some scar.  Some week or 2 after that for the last 4 to 6 weeks she has had cough and shortness of breath.  Shortness of breath is present on exertion.  There is no wheezing or proximal nocturnal dyspnea or orthopnea.  She is now on prednisone for the last 10 days.  With this the cough is resolved but the still  shortness of breath on exertion relieved by rest.  She has no past history of pulmonary embolism or cancer connective tissue disease.  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  In my personal visualization this was also present in the lung images of an abdominal CT from September 2020.  There are some elevation of the right hemidiaphragm.  She is noted to be on losartan.  Of note she is quite active and even does some gardening work.   SYMPTOM SCALE - ILD 10/18/2019   O2 use ra  Shortness of Breath 0 -> 5 scale with 5 being worst (score 6 If unable to do)  At rest 0  Simple tasks - showers, clothes change, eating, shaving 1  Household (dishes, doing bed, laundry) 1  Shopping 1  Walking level at own pace 2  Walking up Stairs *3  Total (30-36) Dyspnea Score 8  How bad is your cough? 0  How bad is your fatigue 2  How bad is nausea 0  How bad is vomiting?  0  How bad is diarrhea? 0  How bad is anxiety? 0  How bad is depression 0       Simple office walk 185 feet x  3 laps goal with forehead probe  10/18/2019   O2 used ra  Number laps completed 3  Comments about pace moder pace - briks  Resting Pulse Ox/HR 96% and 89/min  Final Pulse Ox/HR 96% and 98/min  Desaturated </= 88% no  Desaturated <= 3% points no  Got Tachycardic >/= 90/min yes  Symptoms at end of test No dyspnea, just tired  Miscellaneous comments x     11/27/2019  - Visit   82 year old female never smoker followed in our office for interstitial lung disease. She was last seen for initial consult in August/2021 by Dr. Marchelle Gearing. Plan of care from that office visit was as follows:  Fill out ILD questionnaire, stop prednisone today, do full pulmonary function testing, obtain autoimmune panel, follow-up in 4 to 8 weeks with Dr. Marchelle Gearing.  Patient's connective tissue lab work was essentially negative. Mildly elevated ANA. Titer of 1: 80, reviewed by Dr. Marchelle Gearing  and felt to be of no clinical significance. Patient presenting today after completing pulmonary function testing. Those results are listed below:  11/27/2019-pulmonary function test-FVC 1.95 (86% predicted), postbronchodilator ratio 88, postbronchodilator FEV1 1.83 (109% predicted), no bronchodilator response, TLC 3.82 (83% predicted), DLCO 12.41 (73% predicted), lower level of normal DLCO 14.15  Patient continues to report left abdominal side pain as well as left lower extremity leg pain numbness and tingling.  She plans to follow-up with primary care tomorrow.  She reports the numbness and tingling is worse at night.  She does have type 2 diabetes.  She reports that her blood sugars have been controlled but they have been persistently elevated in the past due to recurrent prednisone use.  She was recently prescribed Lasix by primary care but she did not notice much improvement.    She has an appointment with primary care tomorrow Dr. Genevie Ann to discuss this.  Patient is up-to-date with COVID-19 vaccinations as well as flu vaccine.  Patient reports that she is filled out the ILD questionnaire, and she dropped this back off at our office.  Walked in office today was able to complete 2 laps with oxygen levels dropping to 88% on room air, patient did not require oxygen  Tests:   10/18/2019-connective tissue labs-positive ANA, ANA titer 1: 80, nuclear, speckled Rest of connective tissue labs negative  09/25/2019-CT chest without contrast-mild bibasilar predominant irregular peripheral interstitial and groundglass opacity generally nonspecific and not characteristics and appearance of fibrotic pulmonary sarcoidosis, indeterminate for UIP pattern, consider follow-up ILD protocol CT chest in 1 year to observe stability of fibrotic findings, occasional scattered calcified and benign small pulmonary nodules, elevation of right hemidiaphragm, 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 , MRN: [id number] , ADDRESS: [address] [place] [address] PCP Melida Quitter, MD Patient Care Team: Melida Quitter, MD as PCP - General (Internal Medicine) Jodelle Red, MD as PCP - Cardiology (Cardiology)  This Provider for this visit: Treatment Team:  Attending Provider: Kalman Shan, MD    06/20/2020 -   Chief Complaint  Patient presents with   Follow-up    Reports shortness of breath with activity. Also concerned about back pain, recently diagnosed with  scoliosis.    Fu ILD NOS  HPI Deb Dorsainvil 82 y.o. -returns for followup. Since I last saw her has seen PCP Nadene Rubins, Nyoka Cowden, MD and APP in our office. Her breathing is stable. No cough. However, last 6 weeks has had back pain with some sciatica . Persistent. Severe pain esp with movement and twisting. Says has had MRI and shows lower back scolipsis. Has failed steroid shorts. Going to get 2nd opinion. Pain making dyspnea worse buyt overall feels ILD stable  Could not walk for walk test At home has difficulty wit ADL and changing clothes dye to back pain     ECHO Nov 2021  Echocardiogram 01/11/2020:  Normal LV systolic function with EF 61%. Left ventricle cavity is normal  in size. Mild concentric hypertrophy of the left ventricle. Normal global  wall motion.  Left atrial cavity is mildly dilated.  Trileaflet aortic valve.  Moderate (Grade III) aortic regurgitation.  Moderate (Grade II), posteriorly directed mitral regurgitation.  Mild tricuspid regurgitation.  No evidence of pulmonary hypertension    has a past medical history of Arthritis, Cervical pain, and Hyperglycemia.   reports that she has never smoked. She has never used smokeless tobacco.   OV 10/09/2020  Subjective:  [name], female , DOB: 1940/09/21 , age 14 y.o. , MRN: [id number] , ADDRESS: [address] [place] [address] PCP Melida Quitter, MD Patient Care Team: Melida Quitter, MD as PCP - General (Internal Medicine) Jodelle Red, MD as PCP - Cardiology (Cardiology)  This Provider for this visit: Treatment Team:  Attending Provider: Kalman Shan, MD   Adriana Donovan 82 y.o. -returns for follow-up.  Since her last time symptom score a year ago symptoms are slightly worse but this because of worsening back pain.  Overall she says she feels stable in terms of shortness of breath.  She had a pulmonary function test that is stable.  She had high-resolution CT chest.  There is only mild ILD and it is also stable.  We discussed that antifibrotic therapies are indicated if there is progressive phenotype or if there is high suspicion for IPF.  She does not meet these indications.  In addition we discussed the safety profile of antifibrotic's and based on this this is not consistent with her current quality of life.  She is quite quite hampered by her low back pain.     01/29/2021: - acute sick visit Patient presents today with reports of dry cough, hoarsenss, and chest congestion since Sunday. Her symptoms have persisted since without worsening. She has experienced minimal episodes of shortness of breath with exertion that resolve quickly with rest. Her cough has been non-productive; although, she feels as though she needs to "get stuff up". She has had some nasal congestion in the mornings along with clear rhinorrhea, this subsides later in the day. She denies fever, chills, orthopnea, PND, or leg swelling. She has not had any headaches, body aches, or sore throat. She has not attempted any  over the counter remedies for symptom relief.     OV 06/05/2021  Subjective:  [name], female , DOB: April 24, 1940 , age 57 y.o. , MRN: [id number] , ADDRESS: [address] [place] [address] PCP Melida Quitter, MD Patient Care Team: Melida Quitter, MD as PCP - General (Internal Medicine) Jodelle Red, MD as PCP - Cardiology (Cardiology)  This Provider for this visit: Treatment Team:  Attending Provider: Kalman Shan, MD  06/05/2021 -   Chief Complaint  Patient presents with   Follow-up    Pt states she is about the same since last visit. States she has been having back pain which does make her feel a little worse.     HPI Denys Mccombie 82 y.o. -returns for follow-up.  Last seen in the summer 2022.  Then earlier this year was seen for acute respiratory infection.  She is doing well overall.  She says that she walks 1 mile every day in 20 minutes.  She does not stop for this.  This is unchanged for the last 1 year.  Symptom score is stable.  She did have some steroids to her back because of the back pain and scoliosis which is the most bothersome problem for her.  After this her voice intensity drops.  She says that is a known problem.  Her last pulmonary function test and CT scan was last year.  She is willing to have 1 in the future.   CT Chest data  No results found.   08/28/2021: Today - acute Patient presents today for pain in her right mid back. She was worried it may be her lung. Her breathing is stable. The pain sometimes does take her breath away for a minute but once the pain resolves, this goes away. The pain only occurs upon standing/certain position changes. She describes it has a sharp, stabbing pain. She does have a history of chronic back pain related to scoliosis so is concerned it could be related to this, but wanted to make sure it was not her lungs first. She denies any cough, wheezing, hemoptysis, calf pain/swelling. She  denies recent trauma or known injury.  OV 12/18/2021  Subjective:  [name], female , DOB: 1941/02/21 , age 65 y.o. , MRN: [id number] , ADDRESS: [address] [place] [address] PCP Melida Quitter, MD Patient Care Team: Melida Quitter, MD as PCP - General (Internal Medicine) Jodelle Red, MD as PCP - Cardiology (Cardiology)  This Provider for this visit: Treatment Team:  Attending Provider: Kalman Shan, MD   12/18/2021 -   Chief Complaint  Patient presents with   Follow-up    Review PFT today. No sx noted today.     HPI Havana Ahlgrim 82 y.o. -returns for follow-up.  She continues to be on expectant approach with supportive care.  She does not want to do medications with significant side effect profile.  She again reaffirmed that.  She only says she will do the medication if needed.  She feels stable.  She says she gets some dyspnea with climbing stairs.  She has significant chronic back pain she says she is just living with it.  She is not a surgical candidate she says.  She does get steroid shots.  She had pulmonary function test and walk test and symptom score and CT scan.  All these are stable.  We took a shared decision making to continue to follow-up expectantly.       HRCT Sept 2023  Narrative & Impression  CLINICAL DATA:  Follow-up interstitial lung disease. No current symptoms reported.   EXAM: CT CHEST WITHOUT CONTRAST   TECHNIQUE: Multidetector CT imaging of the chest was performed following the standard protocol without intravenous contrast. High resolution imaging of the lungs, as well as inspiratory and expiratory imaging, was performed.   RADIATION DOSE REDUCTION: This exam was performed according to the departmental dose-optimization program which includes automated exposure control,  adjustment of the mA and/or kV according to patient size and/or use of iterative reconstruction technique.   COMPARISON:   09/25/2020 high-resolution chest CT.   FINDINGS: Cardiovascular: Normal heart size. No significant pericardial effusion/thickening. Atherosclerotic nonaneurysmal thoracic aorta. Normal caliber pulmonary arteries.   Mediastinum/Nodes: No significant thyroid nodules. Unremarkable esophagus. No pathologically enlarged axillary, mediastinal or hilar lymph nodes, noting limited sensitivity for the detection of hilar adenopathy on this noncontrast study.   Lungs/Pleura: No pneumothorax. No pleural effusion. No acute consolidative airspace disease, lung masses or significant pulmonary nodules. Scattered subcentimeter calcified bilateral pulmonary granulomas are unchanged. No significant lobular air trapping or evidence of tracheobronchomalacia on the expiration sequence. Mild patchy subpleural reticulation and ground-glass opacity in both lungs without significant regions of traction bronchiectasis, architectural distortion or frank honeycombing. Dependent basilar predominance to these findings. No appreciable interval progression.   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   Musculoskeletal: No aggressive appearing focal osseous lesions. Mild thoracic spondylosis. Partially visualized surgical hardware from ACDF.   IMPRESSION: 1. Spectrum of findings compatible with mild basilar predominant interstitial lung disease without honeycombing, without appreciable interval progression. 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 UIP is not excluded, but less favored. Findings are indeterminate for UIP per consensus guidelines: Diagnosis of Idiopathic Pulmonary Fibrosis: An Official ATS/ERS/JRS/ALAT Clinical Practice Guideline. Am Rosezetta Schlatter Crit Care Med Vol 198, Iss 5, [phone], Oct 31 2016. 2. 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 3.  Aortic Atherosclerosis (ICD10-I70.0).     Electronically Signed   By: Delbert Phenix M.D.   On: 11/27/2021 16:30     OV 07/07/2022  Subjective:  [name], female , DOB: Jun 26, 1940 , age 65 y.o. , MRN: [id number] , ADDRESS: [address] [place] [address] PCP Melida Quitter, MD Patient Care Team: Melida Quitter, MD as PCP - General (Internal Medicine) Jodelle Red, MD as PCP - Cardiology (Cardiology)  This Provider for this visit: Treatment Team:  Attending Provider: Kalman Shan, MD    07/07/2022 -   Chief Complaint  Patient presents with   Follow-up    F/up on PFT   Mild undifferentiated ILD.  On observation and expectant follow-up supportive care.  - last PF Aug 2022 -> May 2024  - lst CT July 2022 -> sept2023  HPI Evarose Schutt 82 y.o. -returns for follow-up.  She continues to do well from a respiratory standpoint.  She is asymptomatic.  She had a sit/stand test for excess hypoxemia and did not desaturate.  She stopped because of the low back was getting stiff.  Infected soon as a walk-in she only complained about low back pain.  This is what she has done in the past as well.  The low back is a big problem for her.  She is working with physicians for it.  We discussed about referring to an active stretch program such as prednisone but she did not feel this would be a good fit for her.  She says it is very bad the pain is that 24/7.  There is no sciatica.  Reviewed the pulmonary function test indicate chronic long-term stability.    SYMPTOM SCALE - ILD 10/18/2019  10/09/2020   06/05/2021  12/18/2021    O2 use ra ra r ra0   Shortness of Breath 0 -> 5 scale with 5 being  worst (score 6 If unable to do)   0   At rest 0 2 2 0   Simple tasks - showers, clothes change, eating, shaving 1 2 2  0   Household (dishes, doing bed, laundry) 1 3 2  0   Shopping 1 3 2  0   Walking level at own pace 2 1 1  0   Walking up Stairs *3  1 2 2    Total (30-36) Dyspnea Score 8 12 11 2    How bad is your cough? 0 0 0 0   How bad is your fatigue 2 3 2  0   How bad is nausea 0 1 0 0   How bad is vomiting?  0 0 0 0   How bad is diarrhea? 0 0 0 0   How bad is anxiety? 0 0 0 0   How bad is depression 0 0 0 0        Simple office walk 185 feet x  3 laps goal with forehead probe 10/18/2019  06/20/2020  12/18/2021  07/07/2022   O2 used ra ra ra ra  Number laps completed 3 Attempted 3 byt did 1 only  Sit to stand test x 5 aresp  Comments about pace moder pace - briks     Resting Pulse Ox/HR 96% and 89/min 97% and 78 99% ad HR 78 98% ad HR 73  Final Pulse Ox/HR 96% and 98/min 97% and 90 92% and HR 92 99% and HR 74  Desaturated </= 88% no no no no  Desaturated <= 3% points no no yes no  Got Tachycardic >/= 90/min yes yes yes no  Symptoms at end of test No dyspnea, just tired Stopped due to severe back pain and nausea  Complaint of low back pain and slow down and stopped  Miscellaneous comments x Scoliosis issue      PFT     Latest Ref Rng & Units 07/07/2022    3:19 PM 12/18/2021    9:51 AM 10/09/2020    2:54 PM 11/27/2019   12:00 PM  PFT Results  FVC-Pre L 2.12  P 2.17  2.00  1.95   FVC-Predicted Pre % 99  P 100  90  86   FVC-Post L    2.08   FVC-Predicted Post %    92   Pre FEV1/FVC % % 40  P 89  85  84   Post FEV1/FCV % %    88   FEV1-Pre L 0.85  P 1.94  1.70  1.63   FEV1-Predicted Pre % 54  P 121  104  98   FEV1-Post L    1.83   DLCO uncorrected ml/min/mmHg 16.27  P 13.33  14.82  12.41   DLCO UNC% % 97  P 79  87  73   DLCO corrected ml/min/mmHg 16.27  P 13.33  14.82  12.41   DLCO COR %Predicted % 97  P 79  87  73   DLVA Predicted % 100  P 81  105  77   TLC L    3.82   TLC % Predicted %    83   RV % Predicted %    82     P Preliminary result       has a past medical history of Arthritis, Cervical pain, and Hyperglycemia.   reports that she has never smoked. She has never used smokeless tobacco.  Past  Surgical History:  Procedure Laterality Date   ABDOMINAL HYSTERECTOMY  Per patient   SHOULDER SURGERY      Allergies  Allergen Reactions   Rosuvastatin Other (See Comments)    Other reaction(s): Blister on lips, body aches, HA    Immunization History  Administered Date(s) Administered   Fluad Quad(high Dose 65+) 11/13/2019   Influenza, Quadrivalent, Recombinant, Inj, Pf 11/10/2019   Influenza,inj,quad, With Preservative 12/01/2019   Janssen (J&J) SARS-COV-2 Vaccination 05/12/2019   Moderna Sars-Covid-2 Vaccination 05/24/2019, 12/24/2019   Pneumococcal Conjugate-13 11/13/2019   Pneumococcal-Unspecified 11/10/2019    Family History  Problem Relation Age of Onset   Diabetes Father    Leukemia Brother      Current Outpatient Medications:    ACCU-CHEK AVIVA PLUS test strip, , Disp: , Rfl:    Accu-Chek FastClix Lancets MISC, , Disp: , Rfl:    Cholecalciferol 25 MCG (1000 UT) capsule, , Disp: , Rfl:    empagliflozin (JARDIANCE) 10 MG TABS tablet, Take 10 mg by mouth daily., Disp: , Rfl:    Estrogens Conjugated (PREMARIN PO), Take by mouth., Disp: , Rfl:    EYSUVIS 0.25 % SUSP, , Disp: , Rfl:    furosemide (LASIX) 20 MG tablet, Take 20 mg by mouth daily., Disp: , Rfl:    glimepiride (AMARYL) 2 MG tablet, Take 2 mg by mouth daily with breakfast., Disp: , Rfl:    losartan (COZAAR) 25 MG tablet, , Disp: , Rfl:    meclizine (ANTIVERT) 12.5 MG tablet, Take 1 tablet (12.5 mg total) by mouth 3 (three) times daily as needed for dizziness., Disp: 30 tablet, Rfl: 0   montelukast (SINGULAIR) 10 MG tablet, 1 tablet Orally Once a day, Disp: , Rfl:    PREMARIN 0.625 MG tablet, Take 0.625 mg by mouth daily., Disp: , Rfl:       Objective:   Vitals:   07/07/22 1619  BP: 130/70  Pulse: 73  SpO2: 95%  Weight: 149 lb (67.6 kg)  Height: 5\' 1"  (1.549 m)    Estimated body mass index is 28.15 kg/m as calculated from the following:   Height as of this encounter: 5\' 1"  (1.549 m).    Weight as of this encounter: 149 lb (67.6 kg).  @WEIGHTCHANGE @  [employer]   07/07/22 1619  Weight: 149 lb (67.6 kg)     Physical Exam    General: No distress. Looks well Neuro: Alert and Oriented x 3. GCS 15. Speech normal Psych: Pleasant Resp:  Barrel Chest - no.  Wheeze - no, Crackles - yes bae, No overt respiratory distress CVS: Normal heart sounds. Murmurs - no Ext: Stigmata of Connective Tissue Disease - no HEENT: Normal upper airway. PEERL +. No post nasal drip        Assessment:       ICD-10-CM   1. ILD (interstitial lung disease) (HCC)  J84.9          Plan:     Patient Instructions  ILD (interstitial lung disease) (HCC)  -Clinically undifferentiated, mild and stable 2021 -> 2024 may  - Noted desire to avoid polypharmacy and medications with significant side effect - Biggest issue is low back pain  Plan - - do spiro/dlco in 9 months - no anti-fibrotic   Follow-up - Return in 9 months but after spiro/dlco  - symptom score and walk test at followup; 15 min visi    SIGNATURE    Dr. Kalman Shan, M.D., F.C.C.P,  Pulmonary and Critical Care Medicine Staff Physician, [facility] Director - Interstitial Lung Disease  Program  [facility] at [facility] [place], [place], [address]  Pager: [phone], If no answer or between  15:00h - 7:00h: call [phone] Telephone: [phone]  4:43 PM 07/07/2022

## 2022-07-07 NOTE — Patient Instructions (Signed)
Spiro/DLCO performed today.  

## 2022-07-07 NOTE — Patient Instructions (Addendum)
ILD (interstitial lung disease) (HCC)  -Clinically undifferentiated, mild and stable 2021 -> 2024 may  - Noted desire to avoid polypharmacy and medications with significant side effect - Biggest issue is low back pain  Plan - - do spiro/dlco in 9 months - no anti-fibrotic   Follow-up - Return in 9 months but after spiro/dlco  - symptom score and walk test at followup; 15 min visi

## 2022-09-28 ENCOUNTER — Other Ambulatory Visit: Payer: Self-pay | Admitting: Internal Medicine

## 2022-09-28 DIAGNOSIS — R109 Unspecified abdominal pain: Secondary | ICD-10-CM

## 2022-10-16 ENCOUNTER — Ambulatory Visit
Admission: RE | Admit: 2022-10-16 | Discharge: 2022-10-16 | Disposition: A | Discharge status: Home / Self Care | Payer: Medicare PPO | Source: Ambulatory Visit | Attending: Internal Medicine | Admitting: Internal Medicine

## 2022-10-16 DIAGNOSIS — R109 Unspecified abdominal pain: Secondary | ICD-10-CM

## 2022-10-16 MED ORDER — IOPAMIDOL (ISOVUE-300) INJECTION 61%
500.0000 mL | Freq: Once | INTRAVENOUS | Status: AC | PRN
  Administered 2022-10-16: 100 mL via INTRAVENOUS

## 2022-10-21 ENCOUNTER — Encounter: Payer: Self-pay | Admitting: Podiatry

## 2022-10-21 ENCOUNTER — Ambulatory Visit (INDEPENDENT_AMBULATORY_CARE_PROVIDER_SITE_OTHER): Payer: Medicare PPO | Admitting: Podiatry

## 2022-10-21 DIAGNOSIS — M79674 Pain in right toe(s): Secondary | ICD-10-CM | POA: Diagnosis not present

## 2022-10-21 DIAGNOSIS — B351 Tinea unguium: Secondary | ICD-10-CM | POA: Diagnosis not present

## 2022-10-21 DIAGNOSIS — M79675 Pain in left toe(s): Secondary | ICD-10-CM | POA: Diagnosis not present

## 2022-10-21 NOTE — Progress Notes (Signed)
  Subjective:  [name], female    DOB: March 21, 1940,  MRN: [id number]  Chief Complaint  Patient presents with   Diabetes    New patient here for [facility]   Plantar Warts    Bilateral on bottom of foot under toes   82 y.o. female returns for the above complaint.  Patient presents with thickened elongated dystrophic mycotic toenails x 10 mild pain on palpation worse with ambulation is with pressure she would like for me to debride down.  She denies any other acute complaints.  Objective:  There were no vitals filed for this visit. Podiatric Exam: Vascular: dorsalis pedis and posterior tibial pulses are palpable bilateral. Capillary return is immediate. Temperature gradient is WNL. Skin turgor WNL  Sensorium: Normal Semmes Weinstein monofilament test. Normal tactile sensation bilaterally. Nail Exam: Pt has thick disfigured discolored nails with subungual debris noted bilateral entire nail hallux through fifth toenails.  Pain on palpation to the nails. Ulcer Exam: There is no evidence of ulcer or pre-ulcerative changes or infection. Orthopedic Exam: Muscle tone and strength are WNL. No limitations in general ROM. No crepitus or effusions noted.  Skin: No Porokeratosis. No infection or ulcers    Assessment & Plan:   1. Pain due to onychomycosis of toenails of both feet     Patient was evaluated and treated and all questions answered.  Onychomycosis with pain  -Nails palliatively debrided as below. -Educated on self-care  Procedure: Nail Debridement Rationale: pain  Type of Debridement: manual, sharp debridement. Instrumentation: Nail nipper, rotary burr. Number of Nails: 10  Procedures and Treatment: Consent by patient was obtained for treatment procedures. The patient understood the discussion of treatment and procedures well. All questions were answered thoroughly reviewed. Debridement of mycotic and hypertrophic toenails, 1 through 5 bilateral and clearing of  subungual debris. No ulceration, no infection noted.  Return Visit-Office Procedure: Patient instructed to return to the office for a follow up visit 3 months for continued evaluation and treatment.  Nicholes Rough, DPM    No follow-ups on file.

## 2022-12-03 DIAGNOSIS — M180 Bilateral primary osteoarthritis of first carpometacarpal joints: Secondary | ICD-10-CM | POA: Diagnosis not present

## 2022-12-03 DIAGNOSIS — M1812 Unilateral primary osteoarthritis of first carpometacarpal joint, left hand: Secondary | ICD-10-CM | POA: Diagnosis not present

## 2022-12-03 DIAGNOSIS — M1811 Unilateral primary osteoarthritis of first carpometacarpal joint, right hand: Secondary | ICD-10-CM | POA: Diagnosis not present

## 2022-12-29 DIAGNOSIS — M13841 Other specified arthritis, right hand: Secondary | ICD-10-CM | POA: Diagnosis not present

## 2022-12-29 DIAGNOSIS — M1811 Unilateral primary osteoarthritis of first carpometacarpal joint, right hand: Secondary | ICD-10-CM | POA: Diagnosis not present

## 2022-12-29 DIAGNOSIS — M19041 Primary osteoarthritis, right hand: Secondary | ICD-10-CM | POA: Diagnosis not present

## 2023-01-01 DIAGNOSIS — M1811 Unilateral primary osteoarthritis of first carpometacarpal joint, right hand: Secondary | ICD-10-CM | POA: Diagnosis not present

## 2023-01-07 DIAGNOSIS — Z4789 Encounter for other orthopedic aftercare: Secondary | ICD-10-CM | POA: Diagnosis not present

## 2023-01-13 DIAGNOSIS — M1812 Unilateral primary osteoarthritis of first carpometacarpal joint, left hand: Secondary | ICD-10-CM | POA: Diagnosis not present

## 2023-01-13 DIAGNOSIS — M25641 Stiffness of right hand, not elsewhere classified: Secondary | ICD-10-CM | POA: Diagnosis not present

## 2023-01-13 DIAGNOSIS — M19041 Primary osteoarthritis, right hand: Secondary | ICD-10-CM | POA: Diagnosis not present

## 2023-01-13 DIAGNOSIS — Z4789 Encounter for other orthopedic aftercare: Secondary | ICD-10-CM | POA: Diagnosis not present

## 2023-01-13 DIAGNOSIS — M1811 Unilateral primary osteoarthritis of first carpometacarpal joint, right hand: Secondary | ICD-10-CM | POA: Diagnosis not present

## 2023-01-26 DIAGNOSIS — I129 Hypertensive chronic kidney disease with stage 1 through stage 4 chronic kidney disease, or unspecified chronic kidney disease: Secondary | ICD-10-CM | POA: Diagnosis not present

## 2023-01-26 DIAGNOSIS — M47816 Spondylosis without myelopathy or radiculopathy, lumbar region: Secondary | ICD-10-CM | POA: Diagnosis not present

## 2023-01-26 DIAGNOSIS — Z23 Encounter for immunization: Secondary | ICD-10-CM | POA: Diagnosis not present

## 2023-01-26 DIAGNOSIS — E1122 Type 2 diabetes mellitus with diabetic chronic kidney disease: Secondary | ICD-10-CM | POA: Diagnosis not present

## 2023-01-26 DIAGNOSIS — D860 Sarcoidosis of lung: Secondary | ICD-10-CM | POA: Diagnosis not present

## 2023-01-26 DIAGNOSIS — N182 Chronic kidney disease, stage 2 (mild): Secondary | ICD-10-CM | POA: Diagnosis not present

## 2023-01-26 DIAGNOSIS — J849 Interstitial pulmonary disease, unspecified: Secondary | ICD-10-CM | POA: Diagnosis not present

## 2023-01-26 DIAGNOSIS — I700 Atherosclerosis of aorta: Secondary | ICD-10-CM | POA: Diagnosis not present

## 2023-01-27 DIAGNOSIS — Z4789 Encounter for other orthopedic aftercare: Secondary | ICD-10-CM | POA: Diagnosis not present

## 2023-01-27 DIAGNOSIS — M19041 Primary osteoarthritis, right hand: Secondary | ICD-10-CM | POA: Diagnosis not present

## 2023-01-27 DIAGNOSIS — M180 Bilateral primary osteoarthritis of first carpometacarpal joints: Secondary | ICD-10-CM | POA: Diagnosis not present

## 2023-02-01 DIAGNOSIS — M25641 Stiffness of right hand, not elsewhere classified: Secondary | ICD-10-CM | POA: Diagnosis not present

## 2023-02-09 DIAGNOSIS — M25641 Stiffness of right hand, not elsewhere classified: Secondary | ICD-10-CM | POA: Diagnosis not present

## 2023-02-12 DIAGNOSIS — M25641 Stiffness of right hand, not elsewhere classified: Secondary | ICD-10-CM | POA: Diagnosis not present

## 2023-02-15 DIAGNOSIS — M25641 Stiffness of right hand, not elsewhere classified: Secondary | ICD-10-CM | POA: Diagnosis not present

## 2023-02-18 DIAGNOSIS — M25641 Stiffness of right hand, not elsewhere classified: Secondary | ICD-10-CM | POA: Diagnosis not present

## 2023-02-18 DIAGNOSIS — Z4789 Encounter for other orthopedic aftercare: Secondary | ICD-10-CM | POA: Diagnosis not present

## 2023-02-22 DIAGNOSIS — M25641 Stiffness of right hand, not elsewhere classified: Secondary | ICD-10-CM | POA: Diagnosis not present

## 2023-02-26 DIAGNOSIS — M25641 Stiffness of right hand, not elsewhere classified: Secondary | ICD-10-CM | POA: Diagnosis not present

## 2023-03-01 DIAGNOSIS — M25641 Stiffness of right hand, not elsewhere classified: Secondary | ICD-10-CM | POA: Diagnosis not present

## 2023-03-16 DIAGNOSIS — M25642 Stiffness of left hand, not elsewhere classified: Secondary | ICD-10-CM | POA: Diagnosis not present

## 2023-03-18 DIAGNOSIS — M19041 Primary osteoarthritis, right hand: Secondary | ICD-10-CM | POA: Diagnosis not present

## 2023-03-18 DIAGNOSIS — Z4789 Encounter for other orthopedic aftercare: Secondary | ICD-10-CM | POA: Diagnosis not present

## 2023-03-19 DIAGNOSIS — M25531 Pain in right wrist: Secondary | ICD-10-CM | POA: Diagnosis not present

## 2023-03-24 DIAGNOSIS — M25641 Stiffness of right hand, not elsewhere classified: Secondary | ICD-10-CM | POA: Diagnosis not present

## 2023-04-29 DIAGNOSIS — Z4789 Encounter for other orthopedic aftercare: Secondary | ICD-10-CM | POA: Diagnosis not present

## 2023-05-28 DIAGNOSIS — N182 Chronic kidney disease, stage 2 (mild): Secondary | ICD-10-CM | POA: Diagnosis not present

## 2023-05-28 DIAGNOSIS — D860 Sarcoidosis of lung: Secondary | ICD-10-CM | POA: Diagnosis not present

## 2023-05-28 DIAGNOSIS — J029 Acute pharyngitis, unspecified: Secondary | ICD-10-CM | POA: Diagnosis not present

## 2023-05-28 DIAGNOSIS — J849 Interstitial pulmonary disease, unspecified: Secondary | ICD-10-CM | POA: Diagnosis not present

## 2023-05-28 DIAGNOSIS — I351 Nonrheumatic aortic (valve) insufficiency: Secondary | ICD-10-CM | POA: Diagnosis not present

## 2023-05-28 DIAGNOSIS — E1122 Type 2 diabetes mellitus with diabetic chronic kidney disease: Secondary | ICD-10-CM | POA: Diagnosis not present

## 2023-05-28 DIAGNOSIS — R0981 Nasal congestion: Secondary | ICD-10-CM | POA: Diagnosis not present

## 2023-05-28 DIAGNOSIS — R051 Acute cough: Secondary | ICD-10-CM | POA: Diagnosis not present

## 2023-05-28 DIAGNOSIS — Z1152 Encounter for screening for COVID-19: Secondary | ICD-10-CM | POA: Diagnosis not present

## 2023-05-28 DIAGNOSIS — I1310 Hypertensive heart and chronic kidney disease without heart failure, with stage 1 through stage 4 chronic kidney disease, or unspecified chronic kidney disease: Secondary | ICD-10-CM | POA: Diagnosis not present

## 2023-05-28 DIAGNOSIS — U071 COVID-19: Secondary | ICD-10-CM | POA: Diagnosis not present

## 2023-06-11 DIAGNOSIS — I351 Nonrheumatic aortic (valve) insufficiency: Secondary | ICD-10-CM | POA: Diagnosis not present

## 2023-06-11 DIAGNOSIS — J849 Interstitial pulmonary disease, unspecified: Secondary | ICD-10-CM | POA: Diagnosis not present

## 2023-06-11 DIAGNOSIS — E1159 Type 2 diabetes mellitus with other circulatory complications: Secondary | ICD-10-CM | POA: Diagnosis not present

## 2023-06-11 DIAGNOSIS — I1310 Hypertensive heart and chronic kidney disease without heart failure, with stage 1 through stage 4 chronic kidney disease, or unspecified chronic kidney disease: Secondary | ICD-10-CM | POA: Diagnosis not present

## 2023-06-11 DIAGNOSIS — I728 Aneurysm of other specified arteries: Secondary | ICD-10-CM | POA: Diagnosis not present

## 2023-06-11 DIAGNOSIS — N182 Chronic kidney disease, stage 2 (mild): Secondary | ICD-10-CM | POA: Diagnosis not present

## 2023-06-11 DIAGNOSIS — G729 Myopathy, unspecified: Secondary | ICD-10-CM | POA: Diagnosis not present

## 2023-06-11 DIAGNOSIS — E1122 Type 2 diabetes mellitus with diabetic chronic kidney disease: Secondary | ICD-10-CM | POA: Diagnosis not present

## 2023-06-11 DIAGNOSIS — M47816 Spondylosis without myelopathy or radiculopathy, lumbar region: Secondary | ICD-10-CM | POA: Diagnosis not present

## 2023-07-14 DIAGNOSIS — Z9889 Other specified postprocedural states: Secondary | ICD-10-CM | POA: Diagnosis not present

## 2023-07-14 DIAGNOSIS — Z6826 Body mass index (BMI) 26.0-26.9, adult: Secondary | ICD-10-CM | POA: Diagnosis not present

## 2023-07-14 DIAGNOSIS — M546 Pain in thoracic spine: Secondary | ICD-10-CM | POA: Diagnosis not present

## 2023-07-14 DIAGNOSIS — Z9689 Presence of other specified functional implants: Secondary | ICD-10-CM | POA: Diagnosis not present

## 2023-07-19 ENCOUNTER — Other Ambulatory Visit (HOSPITAL_COMMUNITY): Payer: Self-pay | Admitting: Pain Medicine

## 2023-07-19 ENCOUNTER — Telehealth: Payer: Self-pay | Admitting: Cardiology

## 2023-07-19 DIAGNOSIS — M546 Pain in thoracic spine: Secondary | ICD-10-CM

## 2023-07-19 NOTE — Telephone Encounter (Signed)
 Okay to switch from [facility] to [facility]- please call pt to get her scheduled with new provider of her choice

## 2023-07-19 NOTE — Telephone Encounter (Signed)
 Patient came for appointment with her husband she normally sees christopher  at [facility]. She [facility] of [facility] because her spouse comes here and it's closer. Patient would like a women cardiology

## 2023-07-20 ENCOUNTER — Telehealth: Payer: Self-pay | Admitting: Cardiology

## 2023-07-20 NOTE — Telephone Encounter (Signed)
 Pt would like to do a provider switch due to location.  Husband sees Dr Loetta Ringer From: [facility]  To: [facility]

## 2023-07-20 NOTE — Telephone Encounter (Signed)
No objection 

## 2023-07-21 NOTE — Telephone Encounter (Signed)
 Pt decided she wants a female provider only.   Dr Avanell Bob  would you be willing to take her on?

## 2023-08-06 ENCOUNTER — Ambulatory Visit (HOSPITAL_COMMUNITY)
Admission: RE | Admit: 2023-08-06 | Discharge: 2023-08-06 | Disposition: A | Discharge status: Home / Self Care | Source: Ambulatory Visit | Attending: Pain Medicine | Admitting: Pain Medicine

## 2023-08-06 DIAGNOSIS — M40204 Unspecified kyphosis, thoracic region: Secondary | ICD-10-CM | POA: Diagnosis not present

## 2023-08-06 DIAGNOSIS — M546 Pain in thoracic spine: Secondary | ICD-10-CM | POA: Diagnosis not present

## 2023-08-06 DIAGNOSIS — M4805 Spinal stenosis, thoracolumbar region: Secondary | ICD-10-CM | POA: Diagnosis not present

## 2023-08-06 DIAGNOSIS — M4804 Spinal stenosis, thoracic region: Secondary | ICD-10-CM | POA: Diagnosis not present

## 2023-08-06 DIAGNOSIS — D1809 Hemangioma of other sites: Secondary | ICD-10-CM | POA: Diagnosis not present

## 2023-09-01 DIAGNOSIS — Z9689 Presence of other specified functional implants: Secondary | ICD-10-CM | POA: Diagnosis not present

## 2023-09-01 DIAGNOSIS — M5414 Radiculopathy, thoracic region: Secondary | ICD-10-CM | POA: Diagnosis not present

## 2023-09-15 DIAGNOSIS — N182 Chronic kidney disease, stage 2 (mild): Secondary | ICD-10-CM | POA: Diagnosis not present

## 2023-09-15 DIAGNOSIS — Z1389 Encounter for screening for other disorder: Secondary | ICD-10-CM | POA: Diagnosis not present

## 2023-09-15 DIAGNOSIS — I129 Hypertensive chronic kidney disease with stage 1 through stage 4 chronic kidney disease, or unspecified chronic kidney disease: Secondary | ICD-10-CM | POA: Diagnosis not present

## 2023-09-15 DIAGNOSIS — M25542 Pain in joints of left hand: Secondary | ICD-10-CM | POA: Diagnosis not present

## 2023-09-15 DIAGNOSIS — R600 Localized edema: Secondary | ICD-10-CM | POA: Diagnosis not present

## 2023-09-15 DIAGNOSIS — I1310 Hypertensive heart and chronic kidney disease without heart failure, with stage 1 through stage 4 chronic kidney disease, or unspecified chronic kidney disease: Secondary | ICD-10-CM | POA: Diagnosis not present

## 2023-09-15 DIAGNOSIS — M47816 Spondylosis without myelopathy or radiculopathy, lumbar region: Secondary | ICD-10-CM | POA: Diagnosis not present

## 2023-09-15 DIAGNOSIS — E785 Hyperlipidemia, unspecified: Secondary | ICD-10-CM | POA: Diagnosis not present

## 2023-09-16 DIAGNOSIS — M19032 Primary osteoarthritis, left wrist: Secondary | ICD-10-CM | POA: Diagnosis not present

## 2023-09-16 DIAGNOSIS — M19031 Primary osteoarthritis, right wrist: Secondary | ICD-10-CM | POA: Diagnosis not present

## 2023-09-16 DIAGNOSIS — M19042 Primary osteoarthritis, left hand: Secondary | ICD-10-CM | POA: Diagnosis not present

## 2023-09-16 DIAGNOSIS — M19041 Primary osteoarthritis, right hand: Secondary | ICD-10-CM | POA: Diagnosis not present

## 2023-09-16 DIAGNOSIS — G5603 Carpal tunnel syndrome, bilateral upper limbs: Secondary | ICD-10-CM | POA: Diagnosis not present

## 2023-09-28 DIAGNOSIS — M5414 Radiculopathy, thoracic region: Secondary | ICD-10-CM | POA: Diagnosis not present

## 2023-09-29 DIAGNOSIS — E1122 Type 2 diabetes mellitus with diabetic chronic kidney disease: Secondary | ICD-10-CM | POA: Diagnosis not present

## 2023-09-29 DIAGNOSIS — E559 Vitamin D deficiency, unspecified: Secondary | ICD-10-CM | POA: Diagnosis not present

## 2023-09-29 DIAGNOSIS — I129 Hypertensive chronic kidney disease with stage 1 through stage 4 chronic kidney disease, or unspecified chronic kidney disease: Secondary | ICD-10-CM | POA: Diagnosis not present

## 2023-09-29 DIAGNOSIS — N182 Chronic kidney disease, stage 2 (mild): Secondary | ICD-10-CM | POA: Diagnosis not present

## 2023-10-06 DIAGNOSIS — G729 Myopathy, unspecified: Secondary | ICD-10-CM | POA: Diagnosis not present

## 2023-10-06 DIAGNOSIS — D860 Sarcoidosis of lung: Secondary | ICD-10-CM | POA: Diagnosis not present

## 2023-10-06 DIAGNOSIS — Z1331 Encounter for screening for depression: Secondary | ICD-10-CM | POA: Diagnosis not present

## 2023-10-06 DIAGNOSIS — R768 Other specified abnormal immunological findings in serum: Secondary | ICD-10-CM | POA: Diagnosis not present

## 2023-10-06 DIAGNOSIS — E1159 Type 2 diabetes mellitus with other circulatory complications: Secondary | ICD-10-CM | POA: Diagnosis not present

## 2023-10-06 DIAGNOSIS — E1122 Type 2 diabetes mellitus with diabetic chronic kidney disease: Secondary | ICD-10-CM | POA: Diagnosis not present

## 2023-10-06 DIAGNOSIS — J849 Interstitial pulmonary disease, unspecified: Secondary | ICD-10-CM | POA: Diagnosis not present

## 2023-10-06 DIAGNOSIS — R82998 Other abnormal findings in urine: Secondary | ICD-10-CM | POA: Diagnosis not present

## 2023-10-06 DIAGNOSIS — I700 Atherosclerosis of aorta: Secondary | ICD-10-CM | POA: Diagnosis not present

## 2023-10-06 DIAGNOSIS — Z0000 Encounter for general adult medical examination without abnormal findings: Secondary | ICD-10-CM | POA: Diagnosis not present

## 2023-10-06 DIAGNOSIS — N182 Chronic kidney disease, stage 2 (mild): Secondary | ICD-10-CM | POA: Diagnosis not present

## 2023-10-06 DIAGNOSIS — E1121 Type 2 diabetes mellitus with diabetic nephropathy: Secondary | ICD-10-CM | POA: Diagnosis not present

## 2023-10-06 DIAGNOSIS — R001 Bradycardia, unspecified: Secondary | ICD-10-CM | POA: Diagnosis not present

## 2023-10-06 DIAGNOSIS — I1310 Hypertensive heart and chronic kidney disease without heart failure, with stage 1 through stage 4 chronic kidney disease, or unspecified chronic kidney disease: Secondary | ICD-10-CM | POA: Diagnosis not present

## 2023-10-06 DIAGNOSIS — I728 Aneurysm of other specified arteries: Secondary | ICD-10-CM | POA: Diagnosis not present

## 2023-10-06 DIAGNOSIS — Z1339 Encounter for screening examination for other mental health and behavioral disorders: Secondary | ICD-10-CM | POA: Diagnosis not present

## 2023-10-06 DIAGNOSIS — I351 Nonrheumatic aortic (valve) insufficiency: Secondary | ICD-10-CM | POA: Diagnosis not present

## 2023-10-06 DIAGNOSIS — R946 Abnormal results of thyroid function studies: Secondary | ICD-10-CM | POA: Diagnosis not present

## 2023-10-11 DIAGNOSIS — E119 Type 2 diabetes mellitus without complications: Secondary | ICD-10-CM | POA: Diagnosis not present

## 2023-10-11 DIAGNOSIS — H35372 Puckering of macula, left eye: Secondary | ICD-10-CM | POA: Diagnosis not present

## 2023-11-05 DIAGNOSIS — M7052 Other bursitis of knee, left knee: Secondary | ICD-10-CM | POA: Diagnosis not present

## 2023-11-05 DIAGNOSIS — M7051 Other bursitis of knee, right knee: Secondary | ICD-10-CM | POA: Diagnosis not present

## 2023-11-05 DIAGNOSIS — M170 Bilateral primary osteoarthritis of knee: Secondary | ICD-10-CM | POA: Diagnosis not present

## 2023-11-12 DIAGNOSIS — M7051 Other bursitis of knee, right knee: Secondary | ICD-10-CM | POA: Diagnosis not present

## 2023-11-12 DIAGNOSIS — M170 Bilateral primary osteoarthritis of knee: Secondary | ICD-10-CM | POA: Diagnosis not present

## 2023-11-12 DIAGNOSIS — M7052 Other bursitis of knee, left knee: Secondary | ICD-10-CM | POA: Diagnosis not present

## 2023-11-19 DIAGNOSIS — M7051 Other bursitis of knee, right knee: Secondary | ICD-10-CM | POA: Diagnosis not present

## 2023-11-19 DIAGNOSIS — M170 Bilateral primary osteoarthritis of knee: Secondary | ICD-10-CM | POA: Diagnosis not present

## 2023-11-19 DIAGNOSIS — M7052 Other bursitis of knee, left knee: Secondary | ICD-10-CM | POA: Diagnosis not present

## 2023-11-26 DIAGNOSIS — I129 Hypertensive chronic kidney disease with stage 1 through stage 4 chronic kidney disease, or unspecified chronic kidney disease: Secondary | ICD-10-CM | POA: Diagnosis not present

## 2023-11-26 DIAGNOSIS — J849 Interstitial pulmonary disease, unspecified: Secondary | ICD-10-CM | POA: Diagnosis not present

## 2023-11-26 DIAGNOSIS — I1310 Hypertensive heart and chronic kidney disease without heart failure, with stage 1 through stage 4 chronic kidney disease, or unspecified chronic kidney disease: Secondary | ICD-10-CM | POA: Diagnosis not present

## 2023-11-26 DIAGNOSIS — R600 Localized edema: Secondary | ICD-10-CM | POA: Diagnosis not present

## 2023-11-26 DIAGNOSIS — I351 Nonrheumatic aortic (valve) insufficiency: Secondary | ICD-10-CM | POA: Diagnosis not present

## 2023-11-26 DIAGNOSIS — N182 Chronic kidney disease, stage 2 (mild): Secondary | ICD-10-CM | POA: Diagnosis not present

## 2023-11-26 DIAGNOSIS — R0602 Shortness of breath: Secondary | ICD-10-CM | POA: Diagnosis not present

## 2023-11-26 DIAGNOSIS — I728 Aneurysm of other specified arteries: Secondary | ICD-10-CM | POA: Diagnosis not present

## 2023-11-26 DIAGNOSIS — D860 Sarcoidosis of lung: Secondary | ICD-10-CM | POA: Diagnosis not present

## 2023-11-26 DIAGNOSIS — M62838 Other muscle spasm: Secondary | ICD-10-CM | POA: Diagnosis not present

## 2023-11-29 DIAGNOSIS — M25511 Pain in right shoulder: Secondary | ICD-10-CM | POA: Diagnosis not present

## 2023-12-03 DIAGNOSIS — M7052 Other bursitis of knee, left knee: Secondary | ICD-10-CM | POA: Diagnosis not present

## 2023-12-03 DIAGNOSIS — M170 Bilateral primary osteoarthritis of knee: Secondary | ICD-10-CM | POA: Diagnosis not present

## 2023-12-03 DIAGNOSIS — M7051 Other bursitis of knee, right knee: Secondary | ICD-10-CM | POA: Diagnosis not present

## 2023-12-10 DIAGNOSIS — M7052 Other bursitis of knee, left knee: Secondary | ICD-10-CM | POA: Diagnosis not present

## 2023-12-10 DIAGNOSIS — M170 Bilateral primary osteoarthritis of knee: Secondary | ICD-10-CM | POA: Diagnosis not present

## 2023-12-10 DIAGNOSIS — M7051 Other bursitis of knee, right knee: Secondary | ICD-10-CM | POA: Diagnosis not present

## 2023-12-13 DIAGNOSIS — M170 Bilateral primary osteoarthritis of knee: Secondary | ICD-10-CM | POA: Diagnosis not present

## 2023-12-13 DIAGNOSIS — M7051 Other bursitis of knee, right knee: Secondary | ICD-10-CM | POA: Diagnosis not present

## 2023-12-13 DIAGNOSIS — M7052 Other bursitis of knee, left knee: Secondary | ICD-10-CM | POA: Diagnosis not present

## 2023-12-16 ENCOUNTER — Encounter: Payer: Self-pay | Admitting: Internal Medicine

## 2023-12-16 ENCOUNTER — Ambulatory Visit: Attending: Internal Medicine | Admitting: Internal Medicine

## 2023-12-16 VITALS — BP 126/67 | HR 61 | Ht 61.0 in | Wt 147.8 lb

## 2023-12-16 DIAGNOSIS — I351 Nonrheumatic aortic (valve) insufficiency: Secondary | ICD-10-CM | POA: Diagnosis not present

## 2023-12-16 DIAGNOSIS — R0609 Other forms of dyspnea: Secondary | ICD-10-CM

## 2023-12-16 DIAGNOSIS — I340 Nonrheumatic mitral (valve) insufficiency: Secondary | ICD-10-CM

## 2023-12-16 NOTE — Patient Instructions (Signed)
 Medication Instructions:  Your physician recommends that you continue on your current medications as directed. Please refer to the Current Medication list given to you today.  *If you need a refill on your cardiac medications before your next appointment, please call your pharmacy*  Lab Work: TODAY: CBC, CMET, BNP, TSH, Hgb A1c, NMR, LP(a) If you have labs (blood work) drawn today and your tests are completely normal, you will receive your results only by: MyChart Message (if you have MyChart) OR A paper copy in the mail If you have any lab test that is abnormal or we need to change your treatment, we will call you to review the results.  Testing/Procedures: Your physician has requested that you have an echocardiogram. Echocardiography is a painless test that uses sound waves to create images of your heart. It provides your doctor with information about the size and shape of your heart and how well your heart's chambers and valves are working. This procedure takes approximately one hour. There are no restrictions for this procedure. Please do NOT wear cologne, perfume, aftershave, or lotions (deodorant is allowed). Please arrive 15 minutes prior to your appointment time.  Please note: We ask at that you not bring children with you during ultrasound (echo/ vascular) testing. Due to room size and safety concerns, children are not allowed in the ultrasound rooms during exams. Our front office staff cannot provide observation of children in our lobby area while testing is being conducted. An adult accompanying a patient to their appointment will only be allowed in the ultrasound room at the discretion of the ultrasound technician under special circumstances. We apologize for any inconvenience.  Follow-Up: At [facility], you and your health needs are our priority.  As part of our continuing mission to provide you with exceptional heart care, our providers are all part of one team.  This  team includes your primary Cardiologist (physician) and Advanced Practice Providers or APPs (Physician Assistants and Nurse Practitioners) who all work together to provide you with the care you need, when you need it.  Your next appointment:   4 month(s)  Provider:   Vina Gull, MD   We recommend signing up for the patient portal called MyChart.  Sign up information is provided on this After Visit Summary.  MyChart is used to connect with patients for Virtual Visits (Telemedicine).  Patients are able to view lab/test results, encounter notes, upcoming appointments, etc.  Non-urgent messages can be sent to your provider as well.    To learn more about what you can do with MyChart, go to [email or web address].

## 2023-12-16 NOTE — Progress Notes (Signed)
 Cardiology Office Note   Date:  12/16/2023   ID:  Adriana Donovan, DOB 1940/12/11, MRN [id number]   Pt presents for    History of Present Illness: Adriana Donovan is a 83 y.o. female with a history of of HTN, LE edema sarcoidosis, T2DM , ILD She has been followed by [facility]    Echo in Nov 2021 LVEF normal   Mod AR, mod MR Pt treated with lasix and compression hose     Since seen she says she had had a rough year.   Had back surgery this summer (back stimulator), hand surgery earlier in year.  She  Says she is tired  Not sleeping well   Has some SOB, will feel pinch in chest     Describes occsaional PND like spells about 2x per month No CP   No palpitations  No dizziness  Has some chronic LE edema   Lymphedema  Current Meds  Medication Sig   ACCU-CHEK AVIVA PLUS test strip    Accu-Chek FastClix Lancets MISC    Cholecalciferol 25 MCG (1000 UT) capsule    empagliflozin (JARDIANCE) 10 MG TABS tablet Take 10 mg by mouth daily.   Estrogens Conjugated (PREMARIN PO) Take by mouth.   EYSUVIS 0.25 % SUSP    furosemide (LASIX) 20 MG tablet Take 20 mg by mouth daily.   glimepiride (AMARYL) 2 MG tablet Take 2 mg by mouth daily with breakfast.   losartan (COZAAR) 25 MG tablet    meclizine  (ANTIVERT ) 12.5 MG tablet Take 1 tablet (12.5 mg total) by mouth 3 (three) times daily as needed for dizziness.   montelukast (SINGULAIR) 10 MG tablet 1 tablet Orally Once a day   PREMARIN 0.625 MG tablet Take 0.625 mg by mouth daily.     Allergies:   Rosuvastatin   Past Medical History:  Diagnosis Date   Arthritis    Cervical pain    Hyperglycemia     Past Surgical History:  Procedure Laterality Date   ABDOMINAL HYSTERECTOMY     Per patient   SHOULDER SURGERY       Social History:  The patient  reports that she has never smoked. She has never used smokeless tobacco. She reports that she does not drink alcohol and does not use drugs.   Family History:  The patient's family  history includes Diabetes in her father; Leukemia in her brother.    ROS:  Please see the history of present illness. All other systems are reviewed and  Negative to the above problem except as noted.    PHYSICAL EXAM: VS:  BP 126/67 (BP Location: Left Arm, Patient Position: Sitting)   Pulse 61   Ht 5' 1 (1.549 m)   Wt 147 lb 12.8 oz (67 kg)   SpO2 99%   BMI 27.93 kg/m   GEN: Well nourished, well developed, in no acute distress  HEENT: normal  Neck: no JVD, carotid bruits Cardiac: RRR; no murmurs, Respiratory:  clear to auscultation GI: soft, nontender,  No hepatomegaly  MS: no deformity Moving all extremities   Ext  Triv LE edema   EKG:  EKG is ordered today.  NSR 61 bpm   rSR' V1   Lipid Panel No results found for: CHOL, TRIG, HDL, CHOLHDL, VLDL, LDLCALC, LDLDIRECT    Wt Readings from Last 3 Encounters:  12/16/23 147 lb 12.8 oz (67 kg)  07/07/22 149 lb (67.6 kg)  03/03/22 146 lb 6.4 oz (66.4 kg)  ASSESSMENT AND PLAN:  1  Dyspnea    Pt with some symptoms   PND like spells new  No CP Volume overall looks OK on exam   Will check BMET and BNP   Also check TSH, CBC  2   Valvular dz   No murmurs on exam   Mod AI and MR in only echo    Will repeat   3   HTN  BP controlled   4   Lipids     Will check NMR panel and Lpa  5  DM   Check A1C    Follow up based on test results    Current medicines are reviewed at length with the patient today.  The patient does not have concerns regarding medicines.  Signed, Vina Gull, MD  12/16/2023 10:39 AM    [facility] [address] [place], [place], [place]  [address] Phone: [phone]; Fax: [phone]

## 2023-12-18 LAB — CBC
Hematocrit: 44.6 % (ref 34.0–46.6)
Hemoglobin: 13.9 g/dL (ref 11.1–15.9)
MCH: 31 pg (ref 26.6–33.0)
MCHC: 31.2 g/dL — ABNORMAL LOW (ref 31.5–35.7)
MCV: 99 fL — ABNORMAL HIGH (ref 79–97)
Platelets: 160 x10E3/uL (ref 150–450)
RBC: 4.49 x10E6/uL (ref 3.77–5.28)
RDW: 12.1 % (ref 11.7–15.4)
WBC: 8.3 x10E3/uL (ref 3.4–10.8)

## 2023-12-18 LAB — COMPREHENSIVE METABOLIC PANEL WITH GFR
ALT: 14 IU/L (ref 0–32)
AST: 20 IU/L (ref 0–40)
Albumin: 3.9 g/dL (ref 3.7–4.7)
Alkaline Phosphatase: 83 IU/L (ref 48–129)
BUN/Creatinine Ratio: 20 (ref 12–28)
BUN: 13 mg/dL (ref 8–27)
Bilirubin Total: 0.6 mg/dL (ref 0.0–1.2)
CO2: 24 mmol/L (ref 20–29)
Calcium: 9 mg/dL (ref 8.7–10.3)
Chloride: 99 mmol/L (ref 96–106)
Creatinine, Ser: 0.64 mg/dL (ref 0.57–1.00)
Globulin, Total: 2.2 g/dL (ref 1.5–4.5)
Glucose: 151 mg/dL — ABNORMAL HIGH (ref 70–99)
Potassium: 4.1 mmol/L (ref 3.5–5.2)
Sodium: 138 mmol/L (ref 134–144)
Total Protein: 6.1 g/dL (ref 6.0–8.5)
eGFR: 87 mL/min/1.73 (ref >59)

## 2023-12-18 LAB — NMR, LIPOPROFILE
Cholesterol, Total: 204 mg/dL — ABNORMAL HIGH (ref 100–199)
HDL Particle Number: 38.2 umol/L (ref >=30.5)
HDL-C: 78 mg/dL (ref >39)
LDL Particle Number: 1113 nmol/L — ABNORMAL HIGH (ref <1000)
LDL Size: 21.1 nm (ref >20.5)
LDL-C (NIH Calc): 86 mg/dL (ref 0–99)
LP-IR Score: 51 — ABNORMAL HIGH (ref <=45)
Small LDL Particle Number: <90 nmol/L (ref <=527)
Triglycerides: 243 mg/dL — ABNORMAL HIGH (ref 0–149)

## 2023-12-18 LAB — HEMOGLOBIN A1C
Est. average glucose Bld gHb Est-mCnc: 169 mg/dL
Hgb A1c MFr Bld: 7.5 % — ABNORMAL HIGH (ref 4.8–5.6)

## 2023-12-18 LAB — PRO B NATRIURETIC PEPTIDE: NT-Pro BNP: 228 pg/mL (ref 0–738)

## 2023-12-18 LAB — LIPOPROTEIN A (LPA): Lipoprotein (a): 11.4 nmol/L (ref <75.0)

## 2023-12-18 LAB — TSH: TSH: 1.42 u[IU]/mL (ref 0.450–4.500)

## 2023-12-20 ENCOUNTER — Ambulatory Visit: Payer: Self-pay | Admitting: Internal Medicine

## 2023-12-20 DIAGNOSIS — Z79899 Other long term (current) drug therapy: Secondary | ICD-10-CM

## 2023-12-20 DIAGNOSIS — E785 Hyperlipidemia, unspecified: Secondary | ICD-10-CM

## 2023-12-21 DIAGNOSIS — M7052 Other bursitis of knee, left knee: Secondary | ICD-10-CM | POA: Diagnosis not present

## 2023-12-21 DIAGNOSIS — M7051 Other bursitis of knee, right knee: Secondary | ICD-10-CM | POA: Diagnosis not present

## 2023-12-21 DIAGNOSIS — M170 Bilateral primary osteoarthritis of knee: Secondary | ICD-10-CM | POA: Diagnosis not present

## 2023-12-23 NOTE — Progress Notes (Signed)
 Not being fasting should have effected LDL minimally   It would not affect A1C Limit carbs, limit sugars  Try lipitor 20mg     Follow up NMR panel in 8 wks  with liver panel

## 2023-12-24 DIAGNOSIS — M170 Bilateral primary osteoarthritis of knee: Secondary | ICD-10-CM | POA: Diagnosis not present

## 2023-12-24 DIAGNOSIS — M1712 Unilateral primary osteoarthritis, left knee: Secondary | ICD-10-CM | POA: Diagnosis not present

## 2023-12-24 DIAGNOSIS — M7051 Other bursitis of knee, right knee: Secondary | ICD-10-CM | POA: Diagnosis not present

## 2023-12-24 DIAGNOSIS — M7052 Other bursitis of knee, left knee: Secondary | ICD-10-CM | POA: Diagnosis not present

## 2023-12-27 DIAGNOSIS — M25511 Pain in right shoulder: Secondary | ICD-10-CM | POA: Diagnosis not present

## 2023-12-28 DIAGNOSIS — M7052 Other bursitis of knee, left knee: Secondary | ICD-10-CM | POA: Diagnosis not present

## 2023-12-28 DIAGNOSIS — M170 Bilateral primary osteoarthritis of knee: Secondary | ICD-10-CM | POA: Diagnosis not present

## 2023-12-28 DIAGNOSIS — M7051 Other bursitis of knee, right knee: Secondary | ICD-10-CM | POA: Diagnosis not present

## 2023-12-29 MED ORDER — ATORVASTATIN CALCIUM 20 MG PO TABS: 20.0000 mg | ORAL_TABLET | Freq: Every day | ORAL | 3 refills | Status: DC

## 2024-01-05 ENCOUNTER — Telehealth: Payer: Self-pay | Admitting: Internal Medicine

## 2024-01-05 NOTE — Telephone Encounter (Signed)
 Call to patient to discuss concerns. Patient started atorvastatin and has taken 5 doses. She states she has had a stomach ache ever since, which lasts all day long. She denies diarrhea but states she has had this before when she has been on statins. She also says she has bilateral rib pain. She is asking if there is a different drug she can take to lower her cholesterol.

## 2024-01-05 NOTE — Telephone Encounter (Signed)
 Pt c/o medication issue:  1. Name of Medication: atorvastatin (LIPITOR) 20 MG tablet   2. How are you currently taking this medication (dosage and times per day)?    3. Are you having a reaction (difficulty breathing--STAT)? no  4. What is your medication issue? Patient states medication is causing her stomach to hurt. Calling to see what other options they are. Please advise

## 2024-01-06 NOTE — Telephone Encounter (Signed)
 Stop lipitor    Write back in a few weeks to see how feels Diet:  Whole, minimally processed foods.   Lots of vegetables,   No utraprocessed foods  Really limit carbs

## 2024-01-07 NOTE — Addendum Note (Signed)
 Addended by: VICCI ROXIE CROME on: 01/07/2024 06:25 PM   Modules accepted: Orders

## 2024-01-07 NOTE — Telephone Encounter (Signed)
 Patient states she had stopped Lipitor 2 days ago and already her stomach feels better.  Discussed recommendation from Dr. Okey: Diet: Whole, minimally processed foods. Lots of vegetables, No utraprocessed foods Really limit carbs   Patient verbalized understanding and expressed appreciation for call.

## 2024-01-11 DIAGNOSIS — G720 Drug-induced myopathy: Secondary | ICD-10-CM | POA: Diagnosis not present

## 2024-01-11 DIAGNOSIS — M40209 Unspecified kyphosis, site unspecified: Secondary | ICD-10-CM | POA: Diagnosis not present

## 2024-01-11 DIAGNOSIS — I509 Heart failure, unspecified: Secondary | ICD-10-CM | POA: Diagnosis not present

## 2024-01-11 DIAGNOSIS — I110 Hypertensive heart disease with heart failure: Secondary | ICD-10-CM | POA: Diagnosis not present

## 2024-01-11 DIAGNOSIS — D860 Sarcoidosis of lung: Secondary | ICD-10-CM | POA: Diagnosis not present

## 2024-01-11 DIAGNOSIS — Z833 Family history of diabetes mellitus: Secondary | ICD-10-CM | POA: Diagnosis not present

## 2024-01-11 DIAGNOSIS — J8410 Pulmonary fibrosis, unspecified: Secondary | ICD-10-CM | POA: Diagnosis not present

## 2024-01-11 DIAGNOSIS — E1165 Type 2 diabetes mellitus with hyperglycemia: Secondary | ICD-10-CM | POA: Diagnosis not present

## 2024-01-17 NOTE — Patient Instructions (Incomplete)
 ILD (interstitial lung disease) (HCC)  -Clinically undifferentiated, mild and stable 2021 -> 2024 may  - Noted desire to avoid polypharmacy and medications with significant side effect - Biggest issue is low back pain  Plan - - do spiro/dlco in 9 months - no anti-fibrotic   Follow-up - Return in 9 months but after spiro/dlco  - symptom score and walk test at followup; 15 min visi

## 2024-01-17 NOTE — Progress Notes (Unsigned)
 OV 10/18/2019  Subjective:  [name], female , DOB: 1940-03-19 , age 83 y.o. , MRN: [id number] , ADDRESS: [address] Adriana Donovan [place] [address]   10/18/2019 -   Chief Complaint  Patient presents with   Consult    reports feeling at baseline today. reports cough stopped after completing recent course of abx.     [name] 83 y.o. - [place] immigrant.    Then around 22 years ago moved to [facility] [place]  and subsequently to [place] .  She is here because of an abnormal CT chest.  She states some 30 years ago while in [place]  area she had a cough and then a chest x-ray showed something.  She was then admitted to the hospital had a bronchoscopy and apparently and the biopsy showed sarcoidosis.  She took steroids for 6 months and then after that the pulmonologist told her she was done.  Then she was doing fine without any respiratory issues.  Then approximately 15 years ago went to [place] to see her mom and shortly after that just before her return she got sick with respiratory issues.  When she arrived in the room she was admitted to the hospital for 7 days.  She does not know any details.  She says the doctor never give any details.  She does not remember being on oxygen.  She was discharged on steroids which she took for short while.  She was told that the steroids clouded her diagnosis.  No specific etiology was found but then she got better after the steroids course was done.  And she been doing fine ever since.  Then approximately 6-week ago she had some benefit surgery to her left side.  This was around the hip to remove some scar.  Some week or 2 after that for the last 4 to 6 weeks she has had cough and shortness of breath.  Shortness of breath is present on exertion.  There is no wheezing or proximal nocturnal dyspnea or orthopnea.  She is now on prednisone  for the last 10 days.  With this the cough is resolved but the still  shortness of breath on exertion relieved by rest.  She has no past history of pulmonary embolism or cancer connective tissue disease.  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  In my personal visualization this was also present in the lung images of an abdominal CT from September 2020.  There are some elevation of the right hemidiaphragm.  She is noted to be on losartan.  Of note she is quite active and even does some gardening work.   SYMPTOM SCALE - ILD 10/18/2019   O2 use ra  Shortness of Breath 0 -> 5 scale with 5 being worst (score 6 If unable to do)  At rest 0  Simple tasks - showers, clothes change, eating, shaving 1  Household (dishes, doing bed, laundry) 1  Shopping 1  Walking level at own pace 2  Walking up Stairs *3  Total (30-36) Dyspnea Score 8  How bad is your cough? 0  How bad is your fatigue 2  How bad is nausea 0  How bad is vomiting?  0  How bad is diarrhea? 0  How bad is anxiety? 0  How bad is depression 0       Simple office walk 185 feet x  3 laps goal with forehead probe  10/18/2019   O2 used ra  Number laps completed 3  Comments about pace moder pace - briks  Resting Pulse Ox/HR 96% and 89/min  Final Pulse Ox/HR 96% and 98/min  Desaturated </= 88% no  Desaturated <= 3% points no  Got Tachycardic >/= 90/min yes  Symptoms at end of test No dyspnea, just tired  Miscellaneous comments x     11/27/2019  - Visit   83 year old female never smoker followed in our office for interstitial lung disease. She was last seen for initial consult in August/2021 by Dr. Geronimo. Plan of care from that office visit was as follows:  Fill out ILD questionnaire, stop prednisone  today, do full pulmonary function testing, obtain autoimmune panel, follow-up in 4 to 8 weeks with Dr. Geronimo.  Patient's connective tissue lab work was essentially negative. Mildly elevated ANA. Titer of 1: 80, reviewed by Dr. Geronimo  and felt to be of no clinical significance. Patient presenting today after completing pulmonary function testing. Those results are listed below:  11/27/2019-pulmonary function test-FVC 1.95 (86% predicted), postbronchodilator ratio 88, postbronchodilator FEV1 1.83 (109% predicted), no bronchodilator response, TLC 3.82 (83% predicted), DLCO 12.41 (73% predicted), lower level of normal DLCO 14.15  Patient continues to report left abdominal side pain as well as left lower extremity leg pain numbness and tingling.  She plans to follow-up with primary care tomorrow.  She reports the numbness and tingling is worse at night.  She does have type 2 diabetes.  She reports that her blood sugars have been controlled but they have been persistently elevated in the past due to recurrent prednisone  use.  She was recently prescribed Lasix by primary care but she did not notice much improvement.    She has an appointment with primary care tomorrow Dr. Donette to discuss this.  Patient is up-to-date with COVID-19 vaccinations as well as flu vaccine.  Patient reports that she is filled out the ILD questionnaire, and she dropped this back off at our office.  Walked in office today was able to complete 2 laps with oxygen levels dropping to 88% on room air, patient did not require oxygen  Tests:   10/18/2019-connective tissue labs-positive ANA, ANA titer 1: 80, nuclear, speckled Rest of connective tissue labs negative  09/25/2019-CT chest without contrast-mild bibasilar predominant irregular peripheral interstitial and groundglass opacity generally nonspecific and not characteristics and appearance of fibrotic pulmonary sarcoidosis, indeterminate for UIP pattern, consider follow-up ILD protocol CT chest in 1 year to observe stability of fibrotic findings, occasional scattered calcified and benign small pulmonary nodules, elevation of right hemidiaphragm, 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 , MRN: [id number] , ADDRESS: [address] [place] [address] PCP Stephane Leita DEL, MD Patient Care Team: Stephane Leita DEL, MD as PCP - General (Internal Medicine) Lonni Slain, MD as PCP - Cardiology (Cardiology)  This Provider for this visit: Treatment Team:  Attending Provider: Geronimo Amel, MD    06/20/2020 -   Chief Complaint  Patient presents with   Follow-up    Reports shortness of breath with activity. Also concerned about back pain, recently diagnosed with  scoliosis.    Fu ILD NOS  [name] 83 y.o. -returns for followup. Since I last saw her has seen PCP Stephane, Leita DEL, MD and APP in our office. Her breathing is stable. No cough. However, last 6 weeks has had back pain with some sciatica . Persistent. Severe pain esp with movement and twisting. Says has had MRI and shows lower back scolipsis. Has failed steroid shorts. Going to get 2nd opinion. Pain making dyspnea worse buyt overall feels ILD stable  Could not walk for walk test At home has difficulty wit ADL and changing clothes dye to back pain     ECHO Nov 2021  Echocardiogram 01/11/2020:  Normal LV systolic function with EF 61%. Left ventricle cavity is normal  in size. Mild concentric hypertrophy of the left ventricle. Normal global  wall motion.  Left atrial cavity is mildly dilated.  Trileaflet aortic valve.  Moderate (Grade III) aortic regurgitation.  Moderate (Grade II), posteriorly directed mitral regurgitation.  Mild tricuspid regurgitation.  No evidence of pulmonary hypertension    has a past medical history of Arthritis, Cervical pain, and Hyperglycemia.   reports that she has never smoked. She has never used smokeless tobacco.   OV 10/09/2020  Subjective:  [name], female , DOB: 1941/01/14 , age 52 y.o. , MRN: [id number] , ADDRESS: [address] [place] [address] PCP Stephane Leita DEL, MD Patient Care Team: Stephane Leita DEL, MD as PCP - General (Internal Medicine) Lonni Slain, MD as PCP - Cardiology (Cardiology)  This Provider for this visit: Treatment Team:  Attending Provider: Geronimo Amel, MD   Adriana Donovan 83 y.o. -returns for follow-up.  Since her last time symptom score a year ago symptoms are slightly worse but this because of worsening back pain.  Overall she says she feels stable in terms of shortness of breath.  She had a pulmonary function test that is stable.  She had high-resolution CT chest.  There is only mild ILD and it is also stable.  We discussed that antifibrotic therapies are indicated if there is progressive phenotype or if there is high suspicion for IPF.  She does not meet these indications.  In addition we discussed the safety profile of antifibrotic's and based on this this is not consistent with her current quality of life.  She is quite quite hampered by her low back pain.     01/29/2021: - acute sick visit Patient presents today with reports of dry cough, hoarsenss, and chest congestion since Sunday. Her symptoms have persisted since without worsening. She has experienced minimal episodes of shortness of breath with exertion that resolve quickly with rest. Her cough has been non-productive; although, she feels as though she needs to get stuff up. She has had some nasal congestion in the mornings along with clear rhinorrhea, this subsides later in the day. She denies fever, chills, orthopnea, PND, or leg swelling. She has not had any headaches, body aches, or sore throat. She has not attempted any  over the counter remedies for symptom relief.     OV 06/05/2021  Subjective:  [name], female , DOB: 1940/10/26 , age 60 y.o. , MRN: [id number] , ADDRESS: [address] [place] [address] PCP Stephane Leita DEL, MD Patient Care Team: Stephane Leita DEL, MD as PCP - General (Internal Medicine) Lonni Slain, MD as PCP - Cardiology (Cardiology)  This Provider for this visit: Treatment Team:  Attending Provider: Geronimo Amel, MD  06/05/2021 -   Chief Complaint  Patient presents with   Follow-up    Pt states she is about the same since last visit. States she has been having back pain which does make her feel a little worse.     [name] 83 y.o. -returns for follow-up.  Last seen in the summer 2022.  Then earlier this year was seen for acute respiratory infection.  She is doing well overall.  She says that she walks 1 mile every day in 20 minutes.  She does not stop for this.  This is unchanged for the last 1 year.  Symptom score is stable.  She did have some steroids to her back because of the back pain and scoliosis which is the most bothersome problem for her.  After this her voice intensity drops.  She says that is a known problem.  Her last pulmonary function test and CT scan was last year.  She is willing to have 1 in the future.   CT Chest data  No results found.   08/28/2021: Today - acute Patient presents today for pain in her right mid back. She was worried it may be her lung. Her breathing is stable. The pain sometimes does take her breath away for a minute but once the pain resolves, this goes away. The pain only occurs upon standing/certain position changes. She describes it has a sharp, stabbing pain. She does have a history of chronic back pain related to scoliosis so is concerned it could be related to this, but wanted to make sure it was not her lungs first. She denies any cough, wheezing, hemoptysis, calf pain/swelling. She  denies recent trauma or known injury.  OV 12/18/2021  Subjective:  [name], female , DOB: 19-Jun-1940 , age 59 y.o. , MRN: [id number] , ADDRESS: [address] [place] [address] PCP Stephane Leita DEL, MD Patient Care Team: Stephane Leita DEL, MD as PCP - General (Internal Medicine) Lonni Slain, MD as PCP - Cardiology (Cardiology)  This Provider for this visit: Treatment Team:  Attending Provider: Geronimo Amel, MD   12/18/2021 -   Chief Complaint  Patient presents with   Follow-up    Review PFT today. No sx noted today.     [name] 83 y.o. -returns for follow-up.  She continues to be on expectant approach with supportive care.  She does not want to do medications with significant side effect profile.  She again reaffirmed that.  She only says she will do the medication if needed.  She feels stable.  She says she gets some dyspnea with climbing stairs.  She has significant chronic back pain she says she is just living with it.  She is not a surgical candidate she says.  She does get steroid shots.  She had pulmonary function test and walk test and symptom score and CT scan.  All these are stable.  We took a shared decision making to continue to follow-up expectantly.       HRCT Sept 2023  Narrative & Impression  CLINICAL DATA:  Follow-up interstitial lung disease. No current symptoms reported.   EXAM: CT CHEST WITHOUT CONTRAST   TECHNIQUE: Multidetector CT imaging of the chest was performed following the standard protocol without intravenous contrast. High resolution imaging of the lungs, as well as inspiratory and expiratory imaging, was performed.   RADIATION DOSE REDUCTION: This exam was performed according to the departmental dose-optimization program which includes automated exposure control,  adjustment of the mA and/or kV according to patient size and/or use of iterative reconstruction technique.   COMPARISON:   09/25/2020 high-resolution chest CT.   FINDINGS: Cardiovascular: Normal heart size. No significant pericardial effusion/thickening. Atherosclerotic nonaneurysmal thoracic aorta. Normal caliber pulmonary arteries.   Mediastinum/Nodes: No significant thyroid nodules. Unremarkable esophagus. No pathologically enlarged axillary, mediastinal or hilar lymph nodes, noting limited sensitivity for the detection of hilar adenopathy on this noncontrast study.   Lungs/Pleura: No pneumothorax. No pleural effusion. No acute consolidative airspace disease, lung masses or significant pulmonary nodules. Scattered subcentimeter calcified bilateral pulmonary granulomas are unchanged. No significant lobular air trapping or evidence of tracheobronchomalacia on the expiration sequence. Mild patchy subpleural reticulation and ground-glass opacity in both lungs without significant regions of traction bronchiectasis, architectural distortion or frank honeycombing. Dependent basilar predominance to these findings. No appreciable interval progression.   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   Musculoskeletal: No aggressive appearing focal osseous lesions. Mild thoracic spondylosis. Partially visualized surgical hardware from ACDF.   IMPRESSION: 1. Spectrum of findings compatible with mild basilar predominant interstitial lung disease without honeycombing, without appreciable interval progression. 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 UIP is not excluded, but less favored. Findings are indeterminate for UIP per consensus guidelines: Diagnosis of Idiopathic Pulmonary Fibrosis: An Official ATS/ERS/JRS/ALAT Clinical Practice Guideline. Am JINNY Honey Crit Care Med Vol 198, Iss 5, [phone], Oct 31 2016. 2. 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 3.  Aortic Atherosclerosis (ICD10-I70.0).     Electronically Signed   By: Selinda DELENA Blue M.D.   On: 11/27/2021 16:30     OV 07/07/2022  Subjective:  [name], female , DOB: [birth year] , age 32 y.o. , MRN: [id number] , ADDRESS: [address] [place] [address] PCP Stephane Leita DEL, MD Patient Care Team: Stephane Leita DEL, MD as PCP - General (Internal Medicine) Lonni Slain, MD as PCP - Cardiology (Cardiology)  This Provider for this visit: Treatment Team:  Attending Provider: Geronimo Amel, MD    07/07/2022 -   Chief Complaint  Patient presents with   Follow-up    F/up on PFT     HPI [facility] 83 y.o. -returns for follow-up.  She continues to do well from a respiratory standpoint.  She is asymptomatic.  She had a sit/stand test for excess hypoxemia and did not desaturate.  She stopped because of the low back was getting stiff.  Infected soon as a walk-in she only complained about low back pain.  This is what she has done in the past as well.  The low back is a big problem for her.  She is working with physicians for it.  We discussed about referring to an active stretch program such as prednisone  but she did not feel this would be a good fit for her.  She says it is very bad the pain is that 24/7.  There is no sciatica.  Reviewed the pulmonary function test indicate chronic long-term stability.  OV 01/18/2024  Subjective:  [name], female , DOB: 1940/12/24 , age 63 y.o. , MRN: [id number] , ADDRESS: [address] [place] [address] PCP Stephane Leita DEL, MD Patient Care Team: Stephane Leita DEL, MD as PCP - General (Internal Medicine) Okey Vina GAILS, MD as PCP - Cardiology (Cardiology)  This Provider for this visit:  Treatment Team:  Attending Provider: Geronimo Amel, MD  Mild undifferentiated ILD.  On observation and expectant follow-up supportive care.  - last PF Aug 2022 -> May 2024  - lst  CT July 2022 -> sept2023  01/18/2024 -   Chief Complaint  Patient presents with   Interstitial Lung Disease    Pt states breathing has been doing okay      [name] 83 y.o. -Adriana Donovan is an 83 year old female with pulmonary fibrosis who presents for pulmonary evaluation.  She underwent a back stimulator surgery approximately one year ago, which has provided partial relief from her back pain. Despite this, she continues to experience some back pain and has also developed knee pain, which reportedly worsened after attending therapy.  She underwent hand surgery in October and September of the previous year, with no hospitalizations or emergency room visits in 2025.  No breathing problems over the past year, and she states her breathing is very good. She mentions having a stimulator on the right side of her back.  She is scheduled to receive a flu shot from her primary care doctor during an upcoming appointment.   Interim Health status: No new complaints No new medical problems. No new surgeries. No ER visits. No Urgent care visits. No changes to medications      SYMPTOM SCALE - ILD 10/18/2019  10/09/2020   06/05/2021  12/18/2021 and 01/18/2024     O2 use ra ra r ra0   Shortness of Breath 0 -> 5 scale with 5 being worst (score 6 If unable to do)   0   At rest 0 2 2 0   Simple tasks - showers, clothes change, eating, shaving 1 2 2  0   Household (dishes, doing bed, laundry) 1 3 2  0   Shopping 1 3 2  0   Walking level at own pace 2 1 1  0   Walking up Stairs *3 1 2 2    Total (30-36) Dyspnea Score 8 12 11 2    How bad is your cough? 0 0 0 0   How bad is your fatigue 2 3 2  0   How bad is nausea 0 1 0 0   How bad is vomiting?  0 0 0 0   How bad is diarrhea? 0 0 0 0   How bad is anxiety? 0 0 0 0   How bad is depression 0 0 0 0        Simple office walk 185 feet x  3 laps goal with forehead probe 10/18/2019  06/20/2020  12/18/2021  07/07/2022   O2 used ra ra  ra ra  Number laps completed 3 Attempted 3 byt did 1 only  Sit to stand test x 5 aresp  Comments about pace moder pace - briks     Resting Pulse Ox/HR 96% and 89/min 97% and 78 99% ad HR 78 98% ad HR 73  Final Pulse Ox/HR 96% and 98/min 97% and 90 92% and HR 92 99% and HR 74  Desaturated </= 88% no no no no  Desaturated <= 3% points no no yes no  Got Tachycardic >/= 90/min yes yes yes no  Symptoms at end of test No dyspnea, just tired Stopped due to severe back pain and nausea  Complaint of low back pain and slow down and stopped  Miscellaneous comments x Scoliosis issue        PFT     Latest Ref Rng & Units 07/07/2022  3:19 PM 12/18/2021    9:51 AM 10/09/2020    2:54 PM 11/27/2019   12:00 PM  PFT Results  FVC-Pre L 2.12  2.17  2.00  1.95   FVC-Predicted Pre % 99  100  90  86   FVC-Post L    2.08   FVC-Predicted Post %    92   Pre FEV1/FVC % % 40  89  85  84   Post FEV1/FCV % %    88   FEV1-Pre L 0.85  1.94  1.70  1.63   FEV1-Predicted Pre % 54  121  104  98   FEV1-Post L    1.83   DLCO uncorrected ml/min/mmHg 16.27  13.33  14.82  12.41   DLCO UNC% % 97  79  87  73   DLCO corrected ml/min/mmHg 16.27  13.33  14.82  12.41   DLCO COR %Predicted % 97  79  87  73   DLVA Predicted % 100  81  105  77   TLC L    3.82   TLC % Predicted %    83   RV % Predicted %    82        LAB RESULTS last 96 hours No results found.       has a past medical history of Arthritis, Cervical pain, and Hyperglycemia.   reports that she has never smoked. She has never used smokeless tobacco.  Past Surgical History:  Procedure Laterality Date   ABDOMINAL HYSTERECTOMY     Per patient   SHOULDER SURGERY      Allergies  Allergen Reactions   Rosuvastatin Other (See Comments)    Other reaction(s): Blister on lips, body aches, HA    Immunization History  Administered Date(s) Administered   Fluad Quad(high Dose 65+) 11/13/2019   Influenza, Quadrivalent, Recombinant, Inj, Pf 11/10/2019    Influenza,inj,quad, With Preservative 12/01/2019   Janssen (J&J) SARS-COV-2 Vaccination 05/12/2019   Moderna Sars-Covid-2 Vaccination 05/24/2019, 12/24/2019   Pneumococcal Conjugate-13 11/13/2019   Pneumococcal-Unspecified 11/10/2019    Family History  Problem Relation Age of Onset   Diabetes Father    Leukemia Brother      Current Outpatient Medications:    ACCU-CHEK AVIVA PLUS test strip, , Disp: , Rfl:    Accu-Chek FastClix Lancets MISC, , Disp: , Rfl:    empagliflozin (JARDIANCE) 10 MG TABS tablet, Take 10 mg by mouth daily., Disp: , Rfl:    Estrogens Conjugated (PREMARIN PO), Take by mouth., Disp: , Rfl:    furosemide (LASIX) 20 MG tablet, Take 20 mg by mouth daily., Disp: , Rfl:    glimepiride (AMARYL) 2 MG tablet, Take 2 mg by mouth daily with breakfast., Disp: , Rfl:    losartan (COZAAR) 25 MG tablet, , Disp: , Rfl:    PREMARIN 0.625 MG tablet, Take 0.625 mg by mouth daily., Disp: , Rfl:    Cholecalciferol 25 MCG (1000 UT) capsule, , Disp: , Rfl:    EYSUVIS 0.25 % SUSP, , Disp: , Rfl:    meclizine  (ANTIVERT ) 12.5 MG tablet, Take 1 tablet (12.5 mg total) by mouth 3 (three) times daily as needed for dizziness. (Patient not taking: Reported on 01/18/2024), Disp: 30 tablet, Rfl: 0   montelukast (SINGULAIR) 10 MG tablet, 1 tablet Orally Once a day (Patient not taking: Reported on 01/18/2024), Disp: , Rfl:       Objective:   Vitals:   01/18/24 1256  BP: (!) 140/75  Pulse: 65  Temp: 97.9 F (36.6 C)  TempSrc: Oral  SpO2: 96%  Weight: 150 lb (68 kg)  Height: 5' 2 (1.575 m)    Estimated body mass index is 27.44 kg/m as calculated from the following:   Height as of this encounter: 5' 2 (1.575 m).   Weight as of this encounter: 150 lb (68 kg).  @WEIGHTCHANGE @  Filed Weights   01/18/24 1256  Weight: 150 lb (68 kg)     Physical Exam   General: No distress. Looks well O2 at rest: no Cane present: no Sitting in wheel chair: no Frail: no Obese: no Neuro:  Alert and Oriented x 3. GCS 15. Speech normal Psych: Pleasant Resp:  Barrel Chest - no.  Wheeze - no, Crackles - L > R base, No overt respiratory distress CVS: Normal heart sounds. Murmurs - no Ext: Stigmata of Connective Tissue Disease - no BALCK LEFT LOWER WITH STIMULATOR HEENT: Normal upper airway. PEERL +. No post nasal drip        Assessment/     Assessment & Plan ILD (interstitial lung disease) (HCC)    PLAN Patient Instructions  ILD (interstitial lung disease) (HCC)  -Clinically undifferentiated, mild and stable 2021 -> 2024 may  - Noted desire to avoid polypharmacy and medications with significant side effect - Biggest issue is low back pain and now s/p stimulator - Lst CT Sept 2023 - last PFT May 2024  Plan - - do HRCT in 6-9 months - no anti-fibrotic   Follow-up - Return in 6-9 months but after HRCT to see APP  - symptom score and walk test at followup; 15 min visi    FOLLOWUP    Return for - Return in 6-9 months but after HRCT to see APP.    SIGNATURE    Dr. Dorethia Cave, M.D., F.C.C.P,  Pulmonary and Critical Care Medicine Staff Physician, [facility] Center Director - Interstitial Lung Disease  Program  Pulmonary Fibrosis [facility] [place], [place], [address]  Pager: [phone], If no answer or between  15:00h - 7:00h: call [phone] Telephone: [phone]  1:10 PM 01/18/2024

## 2024-01-18 ENCOUNTER — Encounter: Payer: Self-pay | Admitting: Internal Medicine

## 2024-01-18 ENCOUNTER — Ambulatory Visit (INDEPENDENT_AMBULATORY_CARE_PROVIDER_SITE_OTHER): Admitting: Internal Medicine

## 2024-01-18 VITALS — BP 140/75 | HR 65 | Temp 97.9°F | Ht 62.0 in | Wt 150.0 lb

## 2024-01-18 DIAGNOSIS — J849 Interstitial pulmonary disease, unspecified: Secondary | ICD-10-CM

## 2024-02-01 DIAGNOSIS — M5414 Radiculopathy, thoracic region: Secondary | ICD-10-CM | POA: Diagnosis not present

## 2024-02-02 ENCOUNTER — Ambulatory Visit (HOSPITAL_COMMUNITY)
Admission: RE | Admit: 2024-02-02 | Discharge: 2024-02-02 | Disposition: A | Source: Ambulatory Visit | Attending: Internal Medicine | Admitting: Internal Medicine

## 2024-02-02 DIAGNOSIS — I340 Nonrheumatic mitral (valve) insufficiency: Secondary | ICD-10-CM | POA: Insufficient documentation

## 2024-02-02 DIAGNOSIS — I351 Nonrheumatic aortic (valve) insufficiency: Secondary | ICD-10-CM | POA: Diagnosis not present

## 2024-02-02 LAB — ECHOCARDIOGRAM COMPLETE
Area-P 1/2: 3.11 cm2
MV VTI: 1.84 cm2
P 1/2 time: 257 ms
S' Lateral: 3.1 cm

## 2024-02-22 ENCOUNTER — Telehealth (HOSPITAL_COMMUNITY): Payer: Self-pay | Admitting: Pharmacy Technician

## 2024-02-22 ENCOUNTER — Other Ambulatory Visit (HOSPITAL_COMMUNITY): Payer: Self-pay | Admitting: Internal Medicine

## 2024-02-22 DIAGNOSIS — E559 Vitamin D deficiency, unspecified: Secondary | ICD-10-CM | POA: Insufficient documentation

## 2024-02-22 DIAGNOSIS — M810 Age-related osteoporosis without current pathological fracture: Secondary | ICD-10-CM | POA: Insufficient documentation

## 2024-02-22 NOTE — Telephone Encounter (Signed)
 Auth Submission: NO AUTH NEEDED Site of care: [facility] Payer: [employer] MEDICARE Medication & CPT/J Code(s) submitted: Reclast (Zolendronic acid) [id number] Diagnosis Code: M81.0 Route of submission (phone, fax, portal):  Phone # Fax # Auth type: Buy/Bill HB Units/visits requested: 5mg  x 1 dose, every 12 months Reference number:  Approval from: 02/22/2024 to 03/01/25   Checked Availity to check eligibility and if auth was needed   Dagoberto Armour, CPhT [facility] Phone: [phone] 02/22/2024

## 2024-03-15 ENCOUNTER — Other Ambulatory Visit: Payer: Self-pay

## 2024-03-15 ENCOUNTER — Emergency Department (HOSPITAL_COMMUNITY)

## 2024-03-15 ENCOUNTER — Encounter (HOSPITAL_COMMUNITY): Payer: Self-pay

## 2024-03-15 ENCOUNTER — Emergency Department (HOSPITAL_COMMUNITY)
Admission: EM | Admit: 2024-03-15 | Discharge: 2024-03-15 | Disposition: A | Discharge status: Home / Self Care | Attending: Emergency Medicine | Admitting: Emergency Medicine

## 2024-03-15 DIAGNOSIS — R0602 Shortness of breath: Secondary | ICD-10-CM | POA: Insufficient documentation

## 2024-03-15 DIAGNOSIS — R079 Chest pain, unspecified: Secondary | ICD-10-CM

## 2024-03-15 DIAGNOSIS — R0789 Other chest pain: Secondary | ICD-10-CM | POA: Diagnosis present

## 2024-03-15 LAB — CBC WITH DIFFERENTIAL/PLATELET
Abs Immature Granulocytes: 0.04 K/uL (ref 0.00–0.07)
Basophils Absolute: 0.1 K/uL (ref 0.0–0.1)
Basophils Relative: 1 %
Eosinophils Absolute: 0.2 K/uL (ref 0.0–0.5)
Eosinophils Relative: 2 %
HCT: 43.9 % (ref 36.0–46.0)
Hemoglobin: 14.2 g/dL (ref 12.0–15.0)
Immature Granulocytes: 0 %
Lymphocytes Relative: 41 %
Lymphs Abs: 4 K/uL (ref 0.7–4.0)
MCH: 31.7 pg (ref 26.0–34.0)
MCHC: 32.3 g/dL (ref 30.0–36.0)
MCV: 98 fL (ref 80.0–100.0)
Monocytes Absolute: 0.8 K/uL (ref 0.1–1.0)
Monocytes Relative: 8 %
Neutro Abs: 4.6 K/uL (ref 1.7–7.7)
Neutrophils Relative %: 48 %
Platelets: 196 K/uL (ref 150–400)
RBC: 4.48 MIL/uL (ref 3.87–5.11)
RDW: 13.2 % (ref 11.5–15.5)
WBC: 9.8 K/uL (ref 4.0–10.5)
nRBC: 0 % (ref 0.0–0.2)

## 2024-03-15 LAB — BASIC METABOLIC PANEL WITH GFR
Anion gap: 11 (ref 5–15)
BUN: 16 mg/dL (ref 8–23)
CO2: 27 mmol/L (ref 22–32)
Calcium: 9.2 mg/dL (ref 8.9–10.3)
Chloride: 100 mmol/L (ref 98–111)
Creatinine, Ser: 0.61 mg/dL (ref 0.44–1.00)
GFR, Estimated: >60 mL/min
Glucose, Bld: 126 mg/dL — ABNORMAL HIGH (ref 70–99)
Potassium: 4.1 mmol/L (ref 3.5–5.1)
Sodium: 138 mmol/L (ref 135–145)

## 2024-03-15 LAB — TROPONIN T, HIGH SENSITIVITY
Troponin T High Sensitivity: <15 ng/L (ref 0–19)
Troponin T High Sensitivity: <15 ng/L (ref 0–19)

## 2024-03-15 LAB — PRO BRAIN NATRIURETIC PEPTIDE: Pro Brain Natriuretic Peptide: 192 pg/mL

## 2024-03-15 NOTE — Discharge Instructions (Signed)
 Return to the emergency department if your symptoms return or worsen, including chest pain or shortness of breath.  Please contact your cardiologist, Dr. Vina Gull, and see if your February appointment can be moved sooner since you have been experiencing chest pain. Follow-up with your PCP as scheduled.

## 2024-03-15 NOTE — ED Provider Notes (Signed)
 " [place] EMERGENCY DEPARTMENT AT [facility] Provider Note   CSN: [id number] Arrival date & time: 03/15/24  1232     Patient presents with: Chest Pain   Adriana Donovan is a 84 y.o. female.   84 year old female presenting with chest pain.  84 year old female presenting with chest pain.  Patient notes chest pain began around 10 AM while she was watching TV, she reports that chest pain lasted approximately 2 hours, she did take an aspirin at home before presenting to the emergency department today.  She reports chest tightness with radiation to the left arm, no shortness of breath, no abdominal pain/nausea/vomiting, no syncope/presyncope, no lower extremity swelling.  Pain completely subsided after 2 hours and has not returned.  She is established with cardiology and has an appointment in February, history of mitral valve insufficiency and aortic regurgitation.  She is completely asymptomatic at this time.   Chest Pain      Prior to Admission medications  Medication Sig Start Date End Date Taking? Authorizing Provider  ACCU-CHEK AVIVA PLUS test strip  12/25/19   [provider]  Accu-Chek FastClix Lancets MISC  12/20/19   [provider]  Cholecalciferol 25 MCG (1000 UT) capsule  10/28/16   [provider]  empagliflozin (JARDIANCE) 10 MG TABS tablet Take 10 mg by mouth daily.    [provider]  Estrogens Conjugated (PREMARIN PO) Take by mouth.    [provider]  EYSUVIS 0.25 % SUSP  08/25/19   [provider]  furosemide (LASIX) 20 MG tablet Take 20 mg by mouth daily. 11/10/19   [provider]  glimepiride (AMARYL) 2 MG tablet Take 2 mg by mouth daily with breakfast.    [provider]  losartan (COZAAR) 25 MG tablet  12/28/19   [provider]  meclizine  (ANTIVERT ) 12.5 MG tablet Take 1 tablet (12.5 mg total) by mouth 3 (three) times daily as needed for dizziness. Patient not taking:  Reported on 01/18/2024 10/09/17   Wieters, Hallie C, PA-C  montelukast (SINGULAIR) 10 MG tablet 1 tablet Orally Once a day Patient not taking: Reported on 01/18/2024 01/28/22   [provider]  PREMARIN 0.625 MG tablet Take 0.625 mg by mouth daily. 12/29/21   [provider]    Allergies: Rosuvastatin    Review of Systems  Cardiovascular:  Positive for chest pain.    Updated Vital Signs  Vitals:   03/15/24 1237 03/15/24 1718 03/15/24 1800 03/15/24 1948  BP: (!) 160/64 138/64 132/66 (!) 148/79  Pulse: 66 61 88 80  Resp: 17 17 17 15   Temp: 97.7 F (36.5 C) 98.3 F (36.8 C) 98.1 F (36.7 C) 97.9 F (36.6 C)  TempSrc:  Oral Oral Oral  SpO2: 99% 100%  100%  Weight:      Height:         Physical Exam Vitals and nursing note reviewed.  Constitutional:      General: She is not in acute distress.    Appearance: Normal appearance. She is not ill-appearing or toxic-appearing.  HENT:     Head: Normocephalic and atraumatic.  Eyes:     Extraocular Movements: Extraocular movements intact.     Pupils: Pupils are equal, round, and reactive to light.  Cardiovascular:     Rate and Rhythm: Normal rate and regular rhythm.     Heart sounds: Murmur heard.  Pulmonary:     Effort: Pulmonary effort is normal. No respiratory distress.     Breath  sounds: Normal breath sounds.  Abdominal:     Palpations: Abdomen is soft.     Tenderness: There is no abdominal tenderness. There is no guarding.  Musculoskeletal:     Cervical back: Normal range of motion.     Right lower leg: No edema.     Left lower leg: No edema.     Comments: Moves all extremities spontaneously without difficulty  Skin:    General: Skin is warm and dry.  Neurological:     General: No focal deficit present.     Mental Status: She is alert and oriented to person, place, and time.     (all labs ordered are listed, but only abnormal results are displayed) Labs Reviewed  BASIC METABOLIC PANEL WITH GFR -  Abnormal; Notable for the following components:      Result Value   Glucose, Bld 126 (*)    All other components within normal limits  CBC WITH DIFFERENTIAL/PLATELET  PRO BRAIN NATRIURETIC PEPTIDE  TROPONIN T, HIGH SENSITIVITY  TROPONIN T, HIGH SENSITIVITY    EKG: EKG Interpretation Date/Time:  Wednesday March 15 2024 12:47:33 EST Ventricular Rate:  63 PR Interval:  172 QRS Duration:  80 QT Interval:  418 QTC Calculation: 427 R Axis:   -17  Text Interpretation: sinus rhythm Minimal voltage criteria for LVH, may be normal variant ( R in aVL ) Cannot rule out Anterior infarct , age undetermined Abnormal ECG When compared with ECG of 16-Dec-2023 10:04, PREVIOUS ECG IS PRESENT when compared to prior, similar to prior No STEMI Confirmed by Ginger Barefoot ([id number]) on 03/15/2024 8:58:26 PM  Radiology: [facility] Chest 2 View Result Date: 03/15/2024 CLINICAL DATA:  Chest pain EXAM: CHEST - 2 VIEW COMPARISON:  03/03/2022 FINDINGS: Stable heart size and vascularity. Chronic elevation the right hemidiaphragm. Similar diffuse interstitial prominence compatible with background mild interstitial lung disease. No superimposed acute pneumonia but, collapse or consolidation. Negative for acute edema, large effusion or pneumothorax. Trachea midline. Aorta atherosclerotic and tortuous. Thoracic stimulator and lower cervical fusion hardware noted. Degenerative changes and mild scoliosis of the spine. No acute compression fracture. IMPRESSION: 1. Chronic interstitial lung disease pattern. 2. No acute chest process by plain radiography. Electronically Signed   By: CHRISTELLA.  Shick M.D.   On: 03/15/2024 13:35     Procedures   Medications Ordered in the ED - No data to display                                  Medical Decision Making This patient presents to the ED for concern of chest pain, this involves an extensive number of treatment options, and is a complaint that carries with it a high risk of complications and  morbidity.  The differential diagnosis includes ACS, stable versus unstable angina, GERD, pneumonia, muscle pain/strain/brain, dissection   Co morbidities that complicate the patient evaluation  Mitral insufficiency, aortic regurgitation   Additional history obtained:  Additional history obtained from record review External records from outside source obtained and reviewed including prior cardiology note   Lab Tests:  I Ordered, and personally interpreted labs.  The pertinent results include: CBC within normal limits.  BMP unremarkable. Troponin < 15 x 2.  BNP normal, 192.    Imaging Studies ordered:  I ordered imaging studies including CXR  I independently visualized and interpreted imaging which showed 1. Chronic interstitial lung disease pattern. 2. No acute chest process by plain radiography.  I agree with the radiologist interpretation   Cardiac Monitoring: / EKG:  The patient was maintained on a cardiac monitor.  I personally viewed and interpreted the cardiac monitored which showed an underlying rhythm of: Sinus rhythm  Problem List / ED Course / Critical interventions / Medication management  I have reviewed the patients home medicines and have made adjustments as needed   Test / Admission - Considered:  Physical exam is largely reassuring as above, patient does have a history of mitral valve insufficiency as well as aortic regurgitation, she is followed by cardiology and has an appointment in February.  At time of my assessment, she is not symptomatic from chest pain, reports symptoms last approximately 2 hours today but resolved shortly after she arrived to the emergency department.  Her symptoms were associated with tightness of the left arm as well, no nausea/vomiting/abdominal pain, she has not had a recurrence of her symptoms. ACS workup is reassuring as above, EKG without acute ischemic changes, normal troponin x 2, normal BNP.  Clinically patient does not appear  fluid overloaded, she is not demonstrating any signs of respiratory distress and is speaking in full/clear sentences. Based on reassuring workup as above, I do feel that she is safe for discharge, however I recommend that she contact her cardiologist Dr. Vina Gull to have her appointment moved up sooner, patient voiced understanding and is in agreement this plan, strict return precautions discussed, she is appropriate for discharge at this time.  Staffed with Dr. Ginger  Amount and/or Complexity of Data Reviewed Labs: ordered. Radiology: ordered.        Final diagnoses:  Chest pain, unspecified type    ED Discharge Orders     None          Glendia Rocky LOISE DEVONNA 03/15/24 2111    Tegeler, Lonni PARAS, MD 03/15/24 2349  "

## 2024-03-15 NOTE — ED Triage Notes (Signed)
 Reports two hours ago started having left arm and chest pain with mild nausea and SOB.  Reports hx of leaky valve.  Patient did take asa prior to arrival.

## 2024-03-15 NOTE — ED Provider Triage Note (Signed)
 Emergency Medicine Provider Triage Evaluation Note  Adriana Donovan , a 84 y.o. female  was evaluated in triage.  Pt complains of chest pain began around 10 AM.  States had a sharp pain in the left arm that radiated to the left shoulder and now is having sharp pain in the chest.  Pain has continued.  Notes associated shortness of breath as well.  States history of leaky valve, no surgeries.  She is not a blood thinners.  Review of Systems  Positive: Chest pain, shortness of breath Negative: Headache, dizziness, fevers, abdominal pain, nausea/vomiting  Physical Exam  BP (!) 160/64   Pulse 66   Temp 97.7 F (36.5 C)   Resp 17   Ht 5' 2 (1.575 m)   Wt 68 kg   SpO2 99%   BMI 27.42 kg/m  Gen:   Awake, no distress   Resp:  Normal effort  MSK:   Moves extremities without difficulty  Other:    Medical Decision Making  Medically screening exam initiated at 1:06 PM.  Appropriate orders placed.  Adriana Donovan was informed that the remainder of the evaluation will be completed by another provider, this initial triage assessment does not replace that evaluation, and the importance of remaining in the ED until their evaluation is complete.     Adriana Donovan, [place] 03/15/24 1307

## 2024-03-20 ENCOUNTER — Ambulatory Visit (HOSPITAL_COMMUNITY)
Admission: RE | Admit: 2024-03-20 | Discharge: 2024-03-20 | Disposition: A | Source: Ambulatory Visit | Attending: Internal Medicine

## 2024-03-20 VITALS — BP 139/57 | HR 63 | Temp 97.6°F | Resp 15

## 2024-03-20 DIAGNOSIS — E559 Vitamin D deficiency, unspecified: Secondary | ICD-10-CM | POA: Diagnosis present

## 2024-03-20 DIAGNOSIS — M810 Age-related osteoporosis without current pathological fracture: Secondary | ICD-10-CM | POA: Insufficient documentation

## 2024-03-20 MED ORDER — ZOLEDRONIC ACID 5 MG/100ML IV SOLN
INTRAVENOUS | Status: AC
  Filled 2024-03-20: qty 100

## 2024-03-20 MED ORDER — SODIUM CHLORIDE 0.9 % IV SOLN: Freq: Once | INTRAVENOUS | Status: DC

## 2024-03-20 MED ORDER — DIPHENHYDRAMINE HCL 25 MG PO CAPS: 25.0000 mg | ORAL_CAPSULE | Freq: Once | ORAL | Status: DC

## 2024-03-20 MED ORDER — ZOLEDRONIC ACID 5 MG/100ML IV SOLN
5.0000 mg | Freq: Once | INTRAVENOUS | Status: AC
  Administered 2024-03-20: 5 mg via INTRAVENOUS

## 2024-03-20 MED ORDER — ACETAMINOPHEN 325 MG PO TABS: 650.0000 mg | ORAL_TABLET | Freq: Once | ORAL | Status: DC

## 2024-03-20 MED ORDER — DIPHENHYDRAMINE HCL 25 MG PO CAPS
ORAL_CAPSULE | ORAL | Status: AC
  Filled 2024-03-20: qty 1

## 2024-03-20 MED ORDER — ACETAMINOPHEN 325 MG PO TABS
ORAL_TABLET | ORAL | Status: AC
  Filled 2024-03-20: qty 2

## 2024-04-20 ENCOUNTER — Ambulatory Visit: Admitting: Internal Medicine
